# Patient Record
Sex: Male | Born: 1990 | Race: White | Hispanic: No | Marital: Single
Health system: Midwestern US, Community
[De-identification: ages and names within clinical notes are randomized; demographics above are authoritative.]

## PROBLEM LIST (undated history)

## (undated) HISTORY — PX: TONSILLECTOMY: SUR1361

---

## 2013-11-16 ENCOUNTER — Inpatient Hospital Stay: Admit: 2013-11-16 | Discharge: 2013-11-16 | Disposition: A | Discharge status: Home / Self Care

## 2013-11-16 MED ORDER — CIPROFLOXACIN HCL 500 MG PO TABS
500 MG | ORAL_TABLET | Freq: Two times a day (BID) | ORAL | Status: AC
Start: 2013-11-16 — End: 2013-11-21

## 2013-11-16 MED ORDER — IBUPROFEN 800 MG PO TABS
800 MG | ORAL_TABLET | Freq: Four times a day (QID) | ORAL | Status: AC | PRN
Start: 2013-11-16 — End: 2013-11-21

## 2013-11-16 MED ADMIN — ciprofloxacin (CIPRO) tablet 500 mg: 500 mg | ORAL | @ 15:00:00 | NDC 51079018201

## 2013-11-16 MED ADMIN — Tetanus-Diphth-Acell Pertussis (BOOSTRIX) 5-2.5-18.5 LF-MCG/0.5 injection: 0.5 | INTRAMUSCULAR | @ 15:00:00 | NDC 58160084243

## 2013-11-16 MED FILL — BOOSTRIX 5-2.5-18.5 IM SUSP: INTRAMUSCULAR | Qty: 0.5

## 2013-11-16 MED FILL — CIPROFLOXACIN HCL 500 MG PO TABS: 500 MG | ORAL | Qty: 1

## 2013-11-16 NOTE — ED Provider Notes (Signed)
Independent MLP  HPI:  11/16/2013, Time: 10:40 AM         Isaac Martin is a 23 y.o. male presenting to the ED for stepped  on a nail, beginning prior to arrival.  The complaint has been persistent, mild in severity, and worsened by nothing. Patient states he was cleaning out a building when he stepped on a rusty nail that went through the shoe just prior to arrival. He states his tetanus is not up-to-date. Denies any numbness or tingling of the foot is able to weight-bear has pain at the site of the puncture wound. Leading is controlled      Review of Systems:   Pertinent positives and negatives are stated within HPI, all other systems reviewed and are negative.          --------------------------------------------- PAST HISTORY ---------------------------------------------  Past Medical History:  has no past medical history on file.    Past Surgical History:  has past surgical history that includes Tonsillectomy.    Social History:  reports that he has never smoked. He has never used smokeless tobacco. He reports that he does not drink alcohol or use illicit drugs.    Family History: family history is not on file.     The patient???s home medications have been reviewed.    Allergies: Amoxicillin    -------------------------------------------------- RESULTS -------------------------------------------------  All laboratory and radiology results have been personally reviewed by myself   LABS:  No results found for this visit on 11/16/13.    RADIOLOGY:  Interpreted by Radiologist.  XR FOOT LEFT STANDARD    Final Result: IMPRESSION:           1. Soft tissue swelling.         2. No fracture seen.         3 If there is ongoing clinical concern for the great toe    ,additional coned-down views should be obtained thereof to include    a true lateral view without overlap of the other digits.       ------------------------- NURSING NOTES AND VITALS REVIEWED ---------------------------   The nursing notes within the ED encounter  and vital signs as below have been reviewed.   BP 163/79    Pulse 73    Temp(Src) 96.9 ??F (36.1 ??C) (Temporal)    Resp 14    Ht 6\' 3"  (1.905 m)    Wt 200 lb (90.719 kg)    BMI 25.00 kg/m2      SpO2 99%   Oxygen Saturation Interpretation: Normal      ---------------------------------------------------PHYSICAL EXAM--------------------------------------      Constitutional/General: Alert and oriented x3, well appearing, non toxic in NAD  Head: Normocephalic and atraumatic  Eyes: PERRL, EOMI  Mouth: Oropharynx clear, handling secretions, no trismus  Neck: Supple, full ROM,   Pulmonary: Lungs clear to auscultation bilaterally, no wheezes, rales, or rhonchi. Not in respiratory distress  Cardiovascular:  Regular rate and rhythm, no murmurs, gallops, or rubs. 2+ distal pulses  Abdomen: Soft, non tender, non distended,   Extremities: Moves all extremities x 4. Warm and well perfused no tenderness to palpation of the dorsal plantar aspect of the foot. Normal sensation Refill less than 2 seconds normal pulses  Skin: warm and dry without rash puncture wounds of the plantar aspect of the 1st metatarsal Neurologic: GCS 15,  Psych: Normal Affect      ------------------------------ ED COURSE/MEDICAL DECISION MAKING----------------------  Medications   Tetanus-Diphth-Acell Pertussis (BOOSTRIX) injection 0.5 mL (0.5 mLs Intramuscular Given  11/16/13 1119)   ciprofloxacin (CIPRO) tablet 500 mg (500 mg Oral Given 11/16/13 1119)         Medical Decision Making:    Will discharge patient on Cipro and Motrin 800 mg every 6 hours when necessary follow up with primary care 1-2 days    Counseling:   The emergency provider has spoken with the patient and discussed today???s results, in addition to providing specific details for the plan of care and counseling regarding the diagnosis and prognosis.  Questions are answered at this time and they are agreeable with the plan.      --------------------------------- IMPRESSION AND DISPOSITION  ---------------------------------    IMPRESSION  1. Puncture wound of foot        DISPOSITION  Disposition: Discharge to home  Patient condition is good      NOTE: This report was transcribed using voice recognition software. Every effort was made to ensure accuracy; however, inadvertent computerized transcription errors may be present          Griffin BasilRenee M Rouweyha, PA  11/16/13 [address]    Renee M OdenvilleRouweyha, [place]  11/16/13 1134    ATTENDING PROVIDER ATTESTATION:     Supervising Physician, on-site, available for consultation, non-participatory in the evaluation or care of this patient      Delano MetzRobert T Roniyah Llorens, DO  11/23/13 1415

## 2018-01-05 ENCOUNTER — Emergency Department
Admission: EM | Admit: 2018-01-05 | Discharge: 2018-01-05 | Disposition: A | Discharge status: Home / Self Care | Payer: Self-pay | Attending: Emergency Medicine | Admitting: Emergency Medicine

## 2018-01-05 ENCOUNTER — Encounter: Payer: Self-pay | Admitting: Emergency Medicine

## 2018-01-05 ENCOUNTER — Other Ambulatory Visit: Payer: Self-pay

## 2018-01-05 DIAGNOSIS — F1910 Other psychoactive substance abuse, uncomplicated: Secondary | ICD-10-CM | POA: Insufficient documentation

## 2018-01-05 DIAGNOSIS — F329 Major depressive disorder, single episode, unspecified: Secondary | ICD-10-CM | POA: Insufficient documentation

## 2018-01-05 LAB — COMPREHENSIVE METABOLIC PANEL
ALK PHOS: 75 U/L (ref 38–126)
ALT: 15 U/L (ref 0–44)
AST: 25 U/L (ref 15–41)
Albumin: 4.8 g/dL (ref 3.5–5.0)
Anion gap: 8 (ref 5–15)
BUN: 16 mg/dL (ref 6–20)
CHLORIDE: 107 mmol/L (ref 98–111)
CO2: 26 mmol/L (ref 22–32)
CREATININE: 0.96 mg/dL (ref 0.61–1.24)
Calcium: 9.6 mg/dL (ref 8.9–10.3)
GFR calc Af Amer: >60 mL/min (ref >60)
Glucose, Bld: 102 mg/dL — ABNORMAL HIGH (ref 70–99)
Potassium: 4.2 mmol/L (ref 3.5–5.1)
SODIUM: 141 mmol/L (ref 135–145)
Total Bilirubin: 0.5 mg/dL (ref 0.3–1.2)
Total Protein: 8.2 g/dL — ABNORMAL HIGH (ref 6.5–8.1)

## 2018-01-05 LAB — ETHANOL: Alcohol, Ethyl (B): <10 mg/dL (ref <10)

## 2018-01-05 LAB — URINE DRUG SCREEN, QUALITATIVE (ARMC ONLY)
Amphetamines, Ur Screen: NOT DETECTED
Barbiturates, Ur Screen: NOT DETECTED
Cannabinoid 50 Ng, Ur ~~LOC~~: POSITIVE — AB
Cocaine Metabolite,Ur ~~LOC~~: NOT DETECTED
MDMA (Ecstasy)Ur Screen: NOT DETECTED
Methadone Scn, Ur: NOT DETECTED
OPIATE, UR SCREEN: NOT DETECTED
PHENCYCLIDINE (PCP) UR S: NOT DETECTED
Tricyclic, Ur Screen: NOT DETECTED

## 2018-01-05 LAB — CBC
HEMATOCRIT: 42.8 % (ref 40.0–52.0)
Hemoglobin: 14.8 g/dL (ref 13.0–18.0)
MCH: 29.9 pg (ref 26.0–34.0)
MCHC: 34.5 g/dL (ref 32.0–36.0)
MCV: 86.6 fL (ref 80.0–100.0)
PLATELETS: 297 10*3/uL (ref 150–440)
RBC: 4.94 MIL/uL (ref 4.40–5.90)
RDW: 13.2 % (ref 11.5–14.5)
WBC: 11.2 10*3/uL — ABNORMAL HIGH (ref 3.8–10.6)

## 2018-01-05 NOTE — ED Notes (Signed)
Pt dressed out into appropriate behavioral health clothing with this tech and Dave,medic in the rm. Pt belongings consist of brown crocs, tan shorts, a red shirt, a camouflage hat, a black cell phone, a dark brown belt and grey boxers. Pt calm and cooperative while dressing out. Dad took home pt black wallet and a white cell phone charger.

## 2018-01-05 NOTE — ED Notes (Signed)
Pt last used one week ago. Is worried about the "whole fentanyl and heroin deaths" and would like to get detox. [place] time [place]" [place] [facility] [place]. Calm and cooperative

## 2018-01-05 NOTE — ED Notes (Signed)
Urine sent down by tech

## 2018-01-05 NOTE — Discharge Instructions (Addendum)
We have provided several resources for outpatient substance abuse counseling.  You should follow-up as soon as you are able to.  Return to the ER for persistent substance abuse problems, any thoughts of wanting to hurt yourself or anyone else, or any other new or worsening symptoms that concern you.

## 2018-01-05 NOTE — ED Triage Notes (Signed)
Pt arrived via POV with reports of substance abuse. Pt states he has been using IV heroin and fentanyl. Pt states he takes gapapentin daily and xanax night for sleep.  Pt states he has been using any drugs he can and occasionally drinks alcohol, pt states also has been using marijuana daily.  Pt states he last drank alcohol the last 2 nights. 3-4  12 oz beers.  Pt also reports depression with thoughts of "I wish I was dead" Pt states "I don't want to kill myself" Reports SI thoughts only.  Pt states "its not in me to kill myself"    Pt has no psychiatrist and not currently on any antidepressants.

## 2018-01-05 NOTE — ED Notes (Signed)
Pt requesting to leave. Is willing to stay for discharge information.

## 2018-01-05 NOTE — ED Provider Notes (Signed)
[facility] Emergency Department Provider Note ____________________________________________   First MD Initiated Contact with Patient 01/05/18 1735     (approximate)  I have reviewed the triage vital signs and the nursing notes.   HISTORY  Chief Complaint Addiction Problem and Depression    HPI Christopher Stein is a 27 y.o. male with PMH as noted below who presents requesting substance abuse help.  The patient states that he has been abusing multiple both illicit and prescription substances over the last several weeks and wants to stop.  He reports using heroin, most recently 1 week ago as well as marijuana, alcohol most recently last night, and gabapentin and Xanax.  In triage it was documented that the patient expressed some SI, however I discussed this further with the patient.  The patient states that he felt bad and "like I wanted to die" several days back when he was withdrawing from heroin, but he states that he actually did not want to kill himself at any time, and denies SI or HI currently.  He has no prior history of depression or suicidal behavior.  He denies any acute medical complaints.  History reviewed. No pertinent past medical history.  There are no active problems to display for this patient.   Past Surgical History:  Procedure Laterality Date  . TONSILLECTOMY      Prior to Admission medications   Not on File    Allergies Amoxicillin  No family history on file.  Social History Social History   Tobacco Use  . Smoking status: Never Smoker  . Smokeless tobacco: Never Used  Substance Use Topics  . Alcohol use: Not on file  . Drug use: Not on file    Review of Systems  Constitutional: No fever. Eyes: No redness. ENT: No sore throat. Cardiovascular: Denies chest pain. Respiratory: Denies shortness of breath. Gastrointestinal: No vomiting.  Genitourinary: Negative for dysuria.  Musculoskeletal: Negative for back pain. Skin:  Negative for rash. Neurological: Negative for headache.   ____________________________________________   PHYSICAL EXAM:  VITAL SIGNS: ED Triage Vitals  Enc Vitals Group     BP 01/05/18 1639 140/90     Pulse Rate 01/05/18 1639 (!) 107     Resp 01/05/18 1639 20     Temp 01/05/18 1639 99.3 F (37.4 C)     Temp Source 01/05/18 1639 Oral     SpO2 01/05/18 1639 100 %     Weight 01/05/18 1639 190 lb (86.2 kg)     Height 01/05/18 1639 6\' 3"  (1.905 m)     Head Circumference --      Peak Flow --      Pain Score 01/05/18 1713 0     Pain Loc --      Pain Edu? --      Excl. in GC? --     Constitutional: Alert and oriented. Well appearing and in no acute distress. Eyes: Conjunctivae are normal.  Head: Atraumatic. Nose: No congestion/rhinnorhea. Mouth/Throat: Mucous membranes are moist.   Neck: Normal range of motion.  Cardiovascular: Good peripheral circulation. Respiratory: Normal respiratory effort.  Gastrointestinal:  No distention.  Musculoskeletal: Extremities warm and well perfused.  Neurologic:  Normal speech and language. No gross focal neurologic deficits are appreciated.  Skin:  Skin is warm and dry. No rash noted. Psychiatric: Mood and affect are normal.  Slightly anxious appearing.  Speech and behavior are normal.  ____________________________________________   LABS (all labs ordered are listed, but only abnormal results are displayed)  Labs Reviewed  COMPREHENSIVE METABOLIC PANEL - Abnormal; Notable for the following components:      Result Value   Glucose, Bld 102 (*)    Total Protein 8.2 (*)    All other components within normal limits  CBC - Abnormal; Notable for the following components:   WBC 11.2 (*)    All other components within normal limits  ETHANOL  URINE DRUG SCREEN, QUALITATIVE ([facility] ONLY)    ____________________________________________  EKG  ____________________________________________  RADIOLOGY    ____________________________________________   PROCEDURES  Procedure(s) performed: No  Procedures  Critical Care performed: No ____________________________________________   INITIAL IMPRESSION / ASSESSMENT AND PLAN / ED COURSE  Pertinent labs & imaging results that were available during my care of the patient were reviewed by me and considered in my medical decision making (see chart for details).  27 year old male presents voluntarily because of substance abuse, requesting help in terms of detox/rehabilitation.  The patient denies any acute psychiatric symptoms, or any acute medical symptoms.  His exam is unremarkable except for slight tachycardia which is most likely related to anxiety, or possible mild dehydration from his alcohol and other substance use.  At this time, the patient completely denies any SI or HI, and is not a danger to self or others.  There is no indication for [facility] tele-psych consultation or involuntary commitment.  We will consult TTS to provide the patient with detox/rehab resources.  ----------------------------------------- 6:49 PM on 01/05/2018 -----------------------------------------  The patient states that he would like to leave.  He does not want to wait for TTS.  He states that he is perfectly satisfied with just getting some outpatient referrals in the discharge paperwork.  I once again confirmed that the patient has no thoughts of suicide or self-harm, no thoughts to harm anyone else.  He is not a danger to self or others at this time.  He has no evidence of clinically significant withdrawal, and he is stable for discharge at this time.  I gave him thorough return precautions, and he expressed understanding.   ____________________________________________   FINAL CLINICAL IMPRESSION(S) / ED DIAGNOSES  Final diagnoses:   Polysubstance abuse (HCC)      NEW MEDICATIONS STARTED DURING THIS VISIT:  New Prescriptions   No medications on file     Note:  This document was prepared using Dragon voice recognition software and may include unintentional dictation errors.    Dionne Bucy, MD 01/05/18 1850

## 2018-02-05 ENCOUNTER — Emergency Department: Payer: Self-pay

## 2018-02-05 ENCOUNTER — Inpatient Hospital Stay
Admission: EM | Admit: 2018-02-05 | Discharge: 2018-02-07 | DRG: 917 | Discharge status: Against Medical Advice | Payer: Self-pay | Attending: Internal Medicine | Admitting: Internal Medicine

## 2018-02-05 ENCOUNTER — Other Ambulatory Visit: Payer: Self-pay

## 2018-02-05 DIAGNOSIS — T40601A Poisoning by unspecified narcotics, accidental (unintentional), initial encounter: Secondary | ICD-10-CM

## 2018-02-05 DIAGNOSIS — T50901A Poisoning by unspecified drugs, medicaments and biological substances, accidental (unintentional), initial encounter: Secondary | ICD-10-CM | POA: Diagnosis present

## 2018-02-05 DIAGNOSIS — G9340 Encephalopathy, unspecified: Secondary | ICD-10-CM

## 2018-02-05 DIAGNOSIS — D72829 Elevated white blood cell count, unspecified: Secondary | ICD-10-CM | POA: Diagnosis present

## 2018-02-05 DIAGNOSIS — Z881 Allergy status to other antibiotic agents status: Secondary | ICD-10-CM

## 2018-02-05 DIAGNOSIS — T401X1A Poisoning by heroin, accidental (unintentional), initial encounter: Principal | ICD-10-CM | POA: Diagnosis present

## 2018-02-05 DIAGNOSIS — F1290 Cannabis use, unspecified, uncomplicated: Secondary | ICD-10-CM | POA: Diagnosis present

## 2018-02-05 DIAGNOSIS — R7989 Other specified abnormal findings of blood chemistry: Secondary | ICD-10-CM

## 2018-02-05 DIAGNOSIS — F1994 Other psychoactive substance use, unspecified with psychoactive substance-induced mood disorder: Secondary | ICD-10-CM

## 2018-02-05 DIAGNOSIS — R778 Other specified abnormalities of plasma proteins: Secondary | ICD-10-CM

## 2018-02-05 DIAGNOSIS — R6510 Systemic inflammatory response syndrome (SIRS) of non-infectious origin without acute organ dysfunction: Secondary | ICD-10-CM | POA: Diagnosis present

## 2018-02-05 DIAGNOSIS — F1120 Opioid dependence, uncomplicated: Secondary | ICD-10-CM

## 2018-02-05 DIAGNOSIS — I514 Myocarditis, unspecified: Secondary | ICD-10-CM | POA: Diagnosis present

## 2018-02-05 DIAGNOSIS — G92 Toxic encephalopathy: Secondary | ICD-10-CM | POA: Diagnosis present

## 2018-02-05 LAB — COMPREHENSIVE METABOLIC PANEL
ALBUMIN: 4.2 g/dL (ref 3.5–5.0)
ALT: 131 U/L — AB (ref 0–44)
AST: 147 U/L — AB (ref 15–41)
Alkaline Phosphatase: 65 U/L (ref 38–126)
Anion gap: 7 (ref 5–15)
BILIRUBIN TOTAL: 0.3 mg/dL (ref 0.3–1.2)
BUN: 20 mg/dL (ref 6–20)
CO2: 28 mmol/L (ref 22–32)
CREATININE: 0.97 mg/dL (ref 0.61–1.24)
Calcium: 8.9 mg/dL (ref 8.9–10.3)
Chloride: 106 mmol/L (ref 98–111)
GFR calc Af Amer: >60 mL/min (ref >60)
GLUCOSE: 130 mg/dL — AB (ref 70–99)
POTASSIUM: 4.7 mmol/L (ref 3.5–5.1)
Sodium: 141 mmol/L (ref 135–145)
TOTAL PROTEIN: 7.2 g/dL (ref 6.5–8.1)

## 2018-02-05 LAB — CBC WITH DIFFERENTIAL/PLATELET
BASOS ABS: 0 10*3/uL (ref 0–0.1)
BASOS PCT: 0 %
Eosinophils Absolute: 0 10*3/uL (ref 0–0.7)
Eosinophils Relative: 0 %
HEMATOCRIT: 38.7 % — AB (ref 40.0–52.0)
Hemoglobin: 12.7 g/dL — ABNORMAL LOW (ref 13.0–18.0)
LYMPHS PCT: 6 %
Lymphs Abs: 1.4 10*3/uL (ref 1.0–3.6)
MCH: 28.9 pg (ref 26.0–34.0)
MCHC: 32.8 g/dL (ref 32.0–36.0)
MCV: 88.1 fL (ref 80.0–100.0)
Monocytes Absolute: 1.7 10*3/uL — ABNORMAL HIGH (ref 0.2–1.0)
Monocytes Relative: 8 %
NEUTROS ABS: 20 10*3/uL — AB (ref 1.4–6.5)
Neutrophils Relative %: 86 %
Platelets: 271 10*3/uL (ref 150–440)
RBC: 4.39 MIL/uL — AB (ref 4.40–5.90)
RDW: 13.4 % (ref 11.5–14.5)
WBC: 23.2 10*3/uL — ABNORMAL HIGH (ref 3.8–10.6)

## 2018-02-05 LAB — CK: Total CK: 492 U/L — ABNORMAL HIGH (ref 49–397)

## 2018-02-05 LAB — LACTIC ACID, PLASMA: LACTIC ACID, VENOUS: 1.6 mmol/L (ref 0.5–1.9)

## 2018-02-05 LAB — TROPONIN I
TROPONIN I: 0.16 ng/mL — AB (ref <0.03)
Troponin I: 0.07 ng/mL (ref <0.03)

## 2018-02-05 MED ORDER — SODIUM CHLORIDE 0.9 % IV BOLUS
1000.0000 mL | Freq: Once | INTRAVENOUS | Status: AC
  Administered 2018-02-05: 1000 mL via INTRAVENOUS

## 2018-02-05 MED ORDER — HEPARIN (PORCINE) IN NACL 100-0.45 UNIT/ML-% IJ SOLN
1250.0000 [IU]/h | INTRAMUSCULAR | Status: DC
  Administered 2018-02-05: 1100 [IU]/h via INTRAVENOUS
  Administered 2018-02-06: 1250 [IU]/h via INTRAVENOUS
  Filled 2018-02-05 (×3): qty 250

## 2018-02-05 MED ORDER — HEPARIN BOLUS VIA INFUSION
4000.0000 [IU] | Freq: Once | INTRAVENOUS | Status: AC
  Administered 2018-02-05: 4000 [IU] via INTRAVENOUS
  Filled 2018-02-05: qty 4000

## 2018-02-05 MED ORDER — FLAVOXATE HCL 100 MG PO TABS
200.0000 mg | ORAL_TABLET | Freq: Once | ORAL | Status: DC
  Filled 2018-02-05: qty 2

## 2018-02-05 MED ORDER — ONDANSETRON HCL 4 MG/2ML IJ SOLN
4.0000 mg | Freq: Once | INTRAMUSCULAR | Status: AC
  Administered 2018-02-05: 4 mg via INTRAVENOUS

## 2018-02-05 MED ORDER — METOCLOPRAMIDE HCL 5 MG/ML IJ SOLN
10.0000 mg | Freq: Once | INTRAMUSCULAR | Status: AC
  Administered 2018-02-05: 10 mg via INTRAVENOUS

## 2018-02-05 MED ORDER — METOCLOPRAMIDE HCL 5 MG/ML IJ SOLN
INTRAMUSCULAR | Status: AC
  Filled 2018-02-05: qty 2

## 2018-02-05 MED ORDER — ONDANSETRON HCL 4 MG/2ML IJ SOLN
INTRAMUSCULAR | Status: AC
  Filled 2018-02-05: qty 2

## 2018-02-05 MED ORDER — NALOXONE HCL 0.4 MG/ML IJ SOLN: 0.2000 mg | Freq: Once | INTRAMUSCULAR | Status: DC

## 2018-02-05 MED ORDER — NALOXONE HCL 2 MG/2ML IJ SOSY
PREFILLED_SYRINGE | INTRAMUSCULAR | Status: AC
  Administered 2018-02-05: 0.2 mg
  Filled 2018-02-05: qty 2

## 2018-02-05 MED ORDER — ASPIRIN 81 MG PO CHEW
324.0000 mg | CHEWABLE_TABLET | Freq: Once | ORAL | Status: AC
  Administered 2018-02-05: 324 mg via ORAL

## 2018-02-05 NOTE — ED Notes (Signed)
Family has gone home and left number. Mother is Angelique Blonder. Telephone number [phone].

## 2018-02-05 NOTE — ED Notes (Signed)
Patient is resting comfortably. 

## 2018-02-05 NOTE — ED Notes (Signed)
Pt denies pain at this time

## 2018-02-05 NOTE — ED Triage Notes (Signed)
Pt to the er for overdose. Pt states he was shooting up his normal amount of heroin but believes someone cut it with fentanyl. Pt was found by family in bathroom floor. No narcan given by EMS. No respiratory issues.

## 2018-02-05 NOTE — ED Provider Notes (Addendum)
[facility] Emergency Department Provider Note    First MD Initiated Contact with Patient 02/05/18 1944     (approximate)  I have reviewed the triage vital signs and the nursing notes.   HISTORY  Chief Complaint Drug Overdose    HPI Christopher Stein is a 27 y.o. male presents the ER after unintentional overdose on heroin.  States he uses weekly.  States that he was injecting his normal amount but thinks it was mixed with fentanyl.  Does not remember exactly when he was injecting.  Patient was found by family members unresponsive.  EMS was called.  They found the patient he was responsive.  Protecting his airway therefore Phenergan was not given.  He does feel very thirsty.    History reviewed. No pertinent past medical history. No family history on file. Past Surgical History:  Procedure Laterality Date  . TONSILLECTOMY     There are no active problems to display for this patient.     Prior to Admission medications   Not on File    Allergies Amoxicillin    Social History Social History   Tobacco Use  . Smoking status: Never Smoker  . Smokeless tobacco: Never Used  Substance Use Topics  . Alcohol use: Yes    Comment: monthly  . Drug use: Yes    Types: Marijuana    Comment: heroin, xanax,    Review of Systems Patient denies headaches, rhinorrhea, blurry vision, numbness, shortness of breath, chest pain, edema, cough, abdominal pain, nausea, vomiting, diarrhea, dysuria, fevers, rashes or hallucinations unless otherwise stated above in HPI. ____________________________________________   PHYSICAL EXAM:  VITAL SIGNS: Vitals:   02/05/18 2131 02/05/18 2136  BP:    Pulse: (!) 116 (!) 113  Resp: 18 (!) 25  Temp:    SpO2: 100% 98%    Constitutional: Alert and oriented.  Eyes: Conjunctivae are normal. Pupils pinpoint Head: Atraumatic. Nose: No congestion/rhinnorhea. Mouth/Throat: Mucous membranes are moist.   Neck: No stridor.  Painless ROM.  Cardiovascular: Normal rate, regular rhythm. Grossly normal heart sounds.  Good peripheral circulation. Respiratory: Normal respiratory effort.  No retractions. Lungs CTAB. Gastrointestinal: Soft and nontender. No distention. No abdominal bruits. No CVA tenderness. Genitourinary:  Musculoskeletal: No lower extremity tenderness nor edema.  No joint effusions. Neurologic:  Normal speech and language. No gross focal neurologic deficits are appreciated. No facial droop Skin:  Skin is warm, dry and intact. No rash noted. Psychiatric: Mood and affect are normal. Speech and behavior are normal.  ____________________________________________   LABS (all labs ordered are listed, but only abnormal results are displayed)  Results for orders placed or performed during the hospital encounter of 02/05/18 (from the past 24 hour(s))  Troponin I     Status: Abnormal   Collection Time: 02/05/18  7:44 PM  Result Value Ref Range   Troponin I 0.07 (HH) <0.03 ng/mL  CBC with Differential/Platelet     Status: Abnormal   Collection Time: 02/05/18  7:44 PM  Result Value Ref Range   WBC 23.2 (H) 3.8 - 10.6 K/uL   RBC 4.39 (L) 4.40 - 5.90 MIL/uL   Hemoglobin 12.7 (L) 13.0 - 18.0 g/dL   HCT 16.1 (L) 09.6 - 04.5 %   MCV 88.1 80.0 - 100.0 fL   MCH 28.9 26.0 - 34.0 pg   MCHC 32.8 32.0 - 36.0 g/dL   RDW 40.9 81.1 - 91.4 %   Platelets 271 150 - 440 K/uL   Neutrophils Relative % 86 %  Neutro Abs 20.0 (H) 1.4 - 6.5 K/uL   Lymphocytes Relative 6 %   Lymphs Abs 1.4 1.0 - 3.6 K/uL   Monocytes Relative 8 %   Monocytes Absolute 1.7 (H) 0.2 - 1.0 K/uL   Eosinophils Relative 0 %   Eosinophils Absolute 0.0 0 - 0.7 K/uL   Basophils Relative 0 %   Basophils Absolute 0.0 0 - 0.1 K/uL  Comprehensive metabolic panel     Status: Abnormal   Collection Time: 02/05/18  7:44 PM  Result Value Ref Range   Sodium 141 135 - 145 mmol/L   Potassium 4.7 3.5 - 5.1 mmol/L   Chloride 106 98 - 111 mmol/L   CO2 28 22  - 32 mmol/L   Glucose, Bld 130 (H) 70 - 99 mg/dL   BUN 20 6 - 20 mg/dL   Creatinine, Ser 4.93 0.61 - 1.24 mg/dL   Calcium 8.9 8.9 - 24.1 mg/dL   Total Protein 7.2 6.5 - 8.1 g/dL   Albumin 4.2 3.5 - 5.0 g/dL   AST 991 (H) 15 - 41 U/L   ALT 131 (H) 0 - 44 U/L   Alkaline Phosphatase 65 38 - 126 U/L   Total Bilirubin 0.3 0.3 - 1.2 mg/dL   GFR calc non Af Amer >60 >60 mL/min   GFR calc Af Amer >60 >60 mL/min   Anion gap 7 5 - 15  CK     Status: Abnormal   Collection Time: 02/05/18  7:44 PM  Result Value Ref Range   Total CK 492 (H) 49 - 397 U/L  Urine Drug Screen, Qualitative ([facility] only)     Status: Abnormal   Collection Time: 02/05/18 10:40 PM  Result Value Ref Range   Tricyclic, Ur Screen NONE DETECTED NONE DETECTED   Amphetamines, Ur Screen NONE DETECTED NONE DETECTED   MDMA (Ecstasy)Ur Screen NONE DETECTED NONE DETECTED   Cocaine Metabolite,Ur [place] POSITIVE (A) NONE DETECTED   Opiate, Ur Screen POSITIVE (A) NONE DETECTED   Phencyclidine (PCP) Ur S NONE DETECTED NONE DETECTED   Cannabinoid 50 Ng, Ur [place] POSITIVE (A) NONE DETECTED   Barbiturates, Ur Screen NONE DETECTED NONE DETECTED   Benzodiazepine, Ur Scrn POSITIVE (A) NONE DETECTED   Methadone Scn, Ur NONE DETECTED NONE DETECTED  Troponin I     Status: Abnormal   Collection Time: 02/05/18 10:40 PM  Result Value Ref Range   Troponin I 0.16 (HH) <0.03 ng/mL  Lactic acid, plasma     Status: None   Collection Time: 02/05/18 10:40 PM  Result Value Ref Range   Lactic Acid, Venous 1.6 0.5 - 1.9 mmol/L   ____________________________________________  EKG My review and personal interpretation at Time: 19:45   Indication: OD  Rate: 110  Rhythm: sinus Axis: normal Other:  Normal intervals, non specific st abn.  Epsilon wave in inferior and V4-V6? Vs nonspecific IVCD   My review and personal interpretation at Time: 23:39   Indication: OD  Rate: 110  Rhythm: sinus Axis: normal Other:  Normal intervals, non specific st abn.  delta st  elevations now present in her lateral leads.  No reciprocal depression ____________________________________________  RADIOLOGY   ____________________________________________   PROCEDURES  Procedure(s) performed:  .Critical Care Performed by: Willy Eddy, MD Authorized by: Willy Eddy, MD   Critical care provider statement:    Critical care time (minutes):  45   Critical care time was exclusive of:  Separately billable procedures and treating other patients   Critical care was necessary  to treat or prevent imminent or life-threatening deterioration of the following conditions:  Metabolic crisis and toxidrome   Critical care was time spent personally by me on the following activities:  Development of treatment plan with patient or surrogate, discussions with consultants, evaluation of patient's response to treatment, examination of patient, obtaining history from patient or surrogate, ordering and performing treatments and interventions, ordering and review of laboratory studies, ordering and review of radiographic studies, pulse oximetry, re-evaluation of patient's condition and review of old charts      Critical Care performed: yes ____________________________________________   INITIAL IMPRESSION / ASSESSMENT AND PLAN / ED COURSE  Pertinent labs & imaging results that were available during my care of the patient were reviewed by me and considered in my medical decision making (see chart for details).   DDX: overdose, dysrhythmia, electrolytea bn, polysubstance abuse  Christopher Stein is a 27 y.o. who presents to the ED with AMS as described above.  Patient currently protecting his airway.  He is afebrile but mildly tachycardic.  Is slightly encephalopathic.  Denies any pain.  Uncertain downtime.  Given his abnormal presentation will order blood work to evaluate for the above differential.  EKG does show some nonspecific changes and abnormal epsilon wave.  No hypothermia.  May  be normal variant.  Clinical Course as of Feb 07 11  Wed Feb 05, 2018  2058 Troponin noted with elevation of 0.07.  Mother now at bedside.  No report of sudden cardiac death.  Patient denies any cocaine use.  Denies any recent fevers or chest pain.  Does have some nausea at this time.  Possible vasospasm or demand ischemia in the setting of hypoxic episode after overdose.  Do not appreciate any murmurs on cardiac exam on repeat evaluation.  He has had several episodes of vomiting.  Not having any abdominal pain at this time.  Requesting something to drink.  Will send for UDS.  Will serial enzymes and trend troponin.  No fever at this time.  We will continue to monitor.   [PR]  2327 Patient with persistent tachycardia.  Borderline elevation of CK.  No lactic acidosis.  CT imaging without any evidence of bleed.  Based on his presentation I do believe he would benefit from further cardiac monitored during and serial enzymes with admission the hospital.   [PR]  2333 She is repeat troponin did double therefore will heparinize.  Possible myocarditis given leukocytosis.  Cultures have been ordered.  Based on his presentation will admit to hospital for further evaluation, echocardiogram and cardiac evaluation.   [PR]  2337 Patient does admit to injecting cocaine over the past week.   [PR]  2350 Patient's repeat EKG does show delta changes with no ST elevation in V3 V4 V5.  Patient is completely pain-free at this time.  States that he feels much better.  Patient heparinized and given aspirin.  Stat page is called to cardiology.  I discussed case with Dr. Gwen Pounds.  Given his absence of pain or discomfort at this time likely secondary to metabolic process.  Possible SCAD though would expect the patient to be more symptomatic at this time.  Does seem primarily metabolic at this point as there is no true reciprocal changes.  Given diffuse ST changes now with evidence of PR depression do suspect some component of  pericarditis or myocarditis.  Would expect him to be having some form of chest discomfort if this is true ACS.    [PR]    Clinical  Course User Index [PR] Willy Eddy, MD     As part of my medical decision making, I reviewed the following data within the electronic MEDICAL RECORD NUMBER Nursing notes reviewed and incorporated, Labs reviewed, notes from prior ED visits.   ____________________________________________   FINAL CLINICAL IMPRESSION(S) / ED DIAGNOSES  Final diagnoses:  Accidental drug overdose, initial encounter  Elevated troponin  Acute encephalopathy  SIRS (systemic inflammatory response syndrome) (HCC)      NEW MEDICATIONS STARTED DURING THIS VISIT:  New Prescriptions   No medications on file     Note:  This document was prepared using Dragon voice recognition software and may include unintentional dictation errors.    Willy Eddy, MD 02/05/18 2329    Willy Eddy, MD 02/05/18 6213    Willy Eddy, MD 02/05/18 0865    Willy Eddy, MD 02/06/18 [phone]

## 2018-02-05 NOTE — Progress Notes (Signed)
ANTICOAGULATION CONSULT NOTE - Initial Consult  Pharmacy Consult for heparin Indication: chest pain/ACS  Allergies  Allergen Reactions  . Amoxicillin Rash and Other (See Comments)    Has patient had a PCN reaction causing immediate rash, facial/tongue/throat swelling, SOB or lightheadedness with hypotension: Unknown Has patient had a PCN reaction causing severe rash involving mucus membranes or skin necrosis: Unknown Has patient had a PCN reaction that required hospitalization: Unknown Has patient had a PCN reaction occurring within the last 10 years: No If all of the above answers are "NO", then may proceed with Cephalosporin use.     Patient Measurements: Height: 6\' 4"  (193 cm) Weight: 185 lb (83.9 kg) IBW/kg (Calculated) : 86.8 Heparin Dosing Weight: 84 kg  Vital Signs: Temp: 97.7 F (36.5 C) (09/11 1943) Temp Source: Oral (09/11 1943) BP: 94/69 (09/11 2130) Pulse Rate: 113 (09/11 2136)  Labs: Recent Labs    02/05/18 1944 02/05/18 2240  HGB 12.7*  --   HCT 38.7*  --   PLT 271  --   CREATININE 0.97  --   CKTOTAL 492*  --   TROPONINI 0.07* 0.16*    Estimated Creatinine Clearance: 135.7 mL/min (by C-G formula based on SCr of 0.97 mg/dL).   Medical History: History reviewed. No pertinent past medical history.  Medications:  Scheduled:  . aspirin  324 mg Oral Once  . flavoxATE  200 mg Oral Once  . heparin  4,000 Units Intravenous Once  . naLOXone (NARCAN)  injection  0.2 mg Intravenous Once    Assessment: Patient admitted s/t drug overdose from heroin that was apparently cut w/ fentanyl. Patient has initial trops of 0.07 >> 0.16. Patient does not take any meds or anticoagulants. EKG shows borderline ST elevation Patient is being started on heparin drip for acute infarct   Goal of Therapy:  Heparin level 0.3-0.7 units/ml Monitor platelets by anticoagulation protocol: Yes   Plan:  Will bolus w/ heparin 4000 units IV x 1 Will start rate at 1100 units/hr   Baseline labs drawn Will check HL w/ am labs. Will monitor daily CBC's and adjust per HL's  Thomasene Ripple, PharmD, BCPS Clinical Pharmacist 02/05/2018

## 2018-02-06 ENCOUNTER — Inpatient Hospital Stay
Admit: 2018-02-06 | Discharge: 2018-02-06 | Disposition: A | Discharge status: Home / Self Care | Payer: Self-pay | Attending: Internal Medicine | Admitting: Internal Medicine

## 2018-02-06 DIAGNOSIS — T40601A Poisoning by unspecified narcotics, accidental (unintentional), initial encounter: Secondary | ICD-10-CM

## 2018-02-06 DIAGNOSIS — F1994 Other psychoactive substance use, unspecified with psychoactive substance-induced mood disorder: Secondary | ICD-10-CM

## 2018-02-06 DIAGNOSIS — T50901A Poisoning by unspecified drugs, medicaments and biological substances, accidental (unintentional), initial encounter: Secondary | ICD-10-CM | POA: Diagnosis present

## 2018-02-06 DIAGNOSIS — F1120 Opioid dependence, uncomplicated: Secondary | ICD-10-CM

## 2018-02-06 LAB — BASIC METABOLIC PANEL
Anion gap: 10 (ref 5–15)
BUN: 16 mg/dL (ref 6–20)
CHLORIDE: 103 mmol/L (ref 98–111)
CO2: 26 mmol/L (ref 22–32)
Calcium: 8.9 mg/dL (ref 8.9–10.3)
Creatinine, Ser: 0.85 mg/dL (ref 0.61–1.24)
GFR calc Af Amer: >60 mL/min (ref >60)
GFR calc non Af Amer: >60 mL/min (ref >60)
GLUCOSE: 94 mg/dL (ref 70–99)
POTASSIUM: 4.3 mmol/L (ref 3.5–5.1)
SODIUM: 139 mmol/L (ref 135–145)

## 2018-02-06 LAB — URINE DRUG SCREEN, QUALITATIVE (ARMC ONLY)
Amphetamines, Ur Screen: NOT DETECTED
BARBITURATES, UR SCREEN: NOT DETECTED
Benzodiazepine, Ur Scrn: POSITIVE — AB
CANNABINOID 50 NG, UR ~~LOC~~: POSITIVE — AB
COCAINE METABOLITE, UR ~~LOC~~: POSITIVE — AB
MDMA (ECSTASY) UR SCREEN: NOT DETECTED
Methadone Scn, Ur: NOT DETECTED
Opiate, Ur Screen: POSITIVE — AB
PHENCYCLIDINE (PCP) UR S: NOT DETECTED
TRICYCLIC, UR SCREEN: NOT DETECTED

## 2018-02-06 LAB — TROPONIN I
Troponin I: 0.46 ng/mL (ref <0.03)
Troponin I: 0.36 ng/mL (ref <0.03)
Troponin I: 0.28 ng/mL (ref <0.03)

## 2018-02-06 LAB — PROTIME-INR
INR: 1.07
PROTHROMBIN TIME: 13.8 s (ref 11.4–15.2)

## 2018-02-06 LAB — CBC
HCT: 39.7 % — ABNORMAL LOW (ref 40.0–52.0)
Hemoglobin: 13.4 g/dL (ref 13.0–18.0)
MCH: 29.7 pg (ref 26.0–34.0)
MCHC: 33.8 g/dL (ref 32.0–36.0)
MCV: 88.1 fL (ref 80.0–100.0)
PLATELETS: 238 10*3/uL (ref 150–440)
RBC: 4.51 MIL/uL (ref 4.40–5.90)
RDW: 13.5 % (ref 11.5–14.5)
WBC: 14.6 10*3/uL — AB (ref 3.8–10.6)

## 2018-02-06 LAB — HEPARIN LEVEL (UNFRACTIONATED)
HEPARIN UNFRACTIONATED: 0.27 [IU]/mL — AB (ref 0.30–0.70)
Heparin Unfractionated: 0.37 IU/mL (ref 0.30–0.70)
Heparin Unfractionated: 0.39 IU/mL (ref 0.30–0.70)

## 2018-02-06 LAB — ECHOCARDIOGRAM COMPLETE
Height: 76 in
Weight: 2960 oz

## 2018-02-06 LAB — APTT: aPTT: 87 seconds — ABNORMAL HIGH (ref 24–36)

## 2018-02-06 MED ORDER — VITAMIN B-1 100 MG PO TABS
100.0000 mg | ORAL_TABLET | Freq: Every day | ORAL | Status: DC
  Administered 2018-02-06 – 2018-02-07 (×2): 100 mg via ORAL
  Filled 2018-02-06 (×2): qty 1

## 2018-02-06 MED ORDER — LORAZEPAM 2 MG PO TABS: 0.0000 mg | ORAL_TABLET | Freq: Two times a day (BID) | ORAL | Status: DC

## 2018-02-06 MED ORDER — LORAZEPAM 1 MG PO TABS: 1.0000 mg | ORAL_TABLET | Freq: Four times a day (QID) | ORAL | Status: DC | PRN

## 2018-02-06 MED ORDER — ASPIRIN EC 81 MG PO TBEC
81.0000 mg | DELAYED_RELEASE_TABLET | Freq: Every day | ORAL | Status: DC
  Administered 2018-02-06 – 2018-02-07 (×2): 81 mg via ORAL
  Filled 2018-02-06 (×2): qty 1

## 2018-02-06 MED ORDER — LORAZEPAM 2 MG/ML IJ SOLN
1.0000 mg | Freq: Four times a day (QID) | INTRAMUSCULAR | Status: DC | PRN
  Administered 2018-02-07: 1 mg via INTRAVENOUS
  Filled 2018-02-06: qty 1

## 2018-02-06 MED ORDER — DOCUSATE SODIUM 100 MG PO CAPS
100.0000 mg | ORAL_CAPSULE | Freq: Two times a day (BID) | ORAL | Status: DC
  Filled 2018-02-06: qty 1

## 2018-02-06 MED ORDER — HYDROCODONE-ACETAMINOPHEN 5-325 MG PO TABS
1.0000 | ORAL_TABLET | ORAL | Status: DC | PRN
  Administered 2018-02-06 – 2018-02-07 (×2): 2 via ORAL
  Filled 2018-02-06 (×2): qty 2

## 2018-02-06 MED ORDER — ADULT MULTIVITAMIN W/MINERALS CH
1.0000 | ORAL_TABLET | Freq: Every day | ORAL | Status: DC
  Administered 2018-02-06 – 2018-02-07 (×2): 1 via ORAL
  Filled 2018-02-06 (×2): qty 1

## 2018-02-06 MED ORDER — BUPRENORPHINE HCL-NALOXONE HCL 8-2 MG SL SUBL
2.0000 | SUBLINGUAL_TABLET | SUBLINGUAL | Status: AC
  Administered 2018-02-06: 2 via SUBLINGUAL
  Filled 2018-02-06: qty 2

## 2018-02-06 MED ORDER — SODIUM CHLORIDE 0.9 % IV SOLN
INTRAVENOUS | Status: DC
  Administered 2018-02-06 – 2018-02-07 (×3): via INTRAVENOUS

## 2018-02-06 MED ORDER — ONDANSETRON HCL 4 MG PO TABS: 4.0000 mg | ORAL_TABLET | Freq: Four times a day (QID) | ORAL | Status: DC | PRN

## 2018-02-06 MED ORDER — BUPRENORPHINE HCL-NALOXONE HCL 2-0.5 MG SL SUBL
2.0000 | SUBLINGUAL_TABLET | Freq: Two times a day (BID) | SUBLINGUAL | Status: DC
  Administered 2018-02-07: 2 via SUBLINGUAL
  Filled 2018-02-06: qty 2

## 2018-02-06 MED ORDER — HEPARIN SODIUM (PORCINE) 5000 UNIT/ML IJ SOLN: 5000.0000 [IU] | Freq: Three times a day (TID) | INTRAMUSCULAR | Status: DC

## 2018-02-06 MED ORDER — ACETAMINOPHEN 650 MG RE SUPP: 650.0000 mg | Freq: Four times a day (QID) | RECTAL | Status: DC | PRN

## 2018-02-06 MED ORDER — BISACODYL 5 MG PO TBEC: 5.0000 mg | DELAYED_RELEASE_TABLET | Freq: Every day | ORAL | Status: DC | PRN

## 2018-02-06 MED ORDER — THIAMINE HCL 100 MG/ML IJ SOLN: 100.0000 mg | Freq: Every day | INTRAMUSCULAR | Status: DC

## 2018-02-06 MED ORDER — LORAZEPAM 2 MG PO TABS
0.0000 mg | ORAL_TABLET | Freq: Four times a day (QID) | ORAL | Status: DC
  Administered 2018-02-06 – 2018-02-07 (×4): 2 mg via ORAL
  Administered 2018-02-07: 1 mg via ORAL
  Administered 2018-02-07: 2 mg via ORAL
  Filled 2018-02-06 (×6): qty 1

## 2018-02-06 MED ORDER — ACETAMINOPHEN 325 MG PO TABS: 650.0000 mg | ORAL_TABLET | Freq: Four times a day (QID) | ORAL | Status: DC | PRN

## 2018-02-06 MED ORDER — FOLIC ACID 1 MG PO TABS
1.0000 mg | ORAL_TABLET | Freq: Every day | ORAL | Status: DC
  Administered 2018-02-06 – 2018-02-07 (×2): 1 mg via ORAL
  Filled 2018-02-06 (×2): qty 1

## 2018-02-06 MED ORDER — ONDANSETRON HCL 4 MG/2ML IJ SOLN: 4.0000 mg | Freq: Four times a day (QID) | INTRAMUSCULAR | Status: DC | PRN

## 2018-02-06 NOTE — Consult Note (Signed)
[facility] Face-to-Face Psychiatry Consult   Reason for Consult: Consult for this 27 year old man who came into the hospital with an accidental drug overdose Referring Physician: Mody Patient Identification: Christopher Stein MRN:  [id number] Principal Diagnosis: Opiate overdose [facility]) Diagnosis:   Patient Active Problem List   Diagnosis Date Noted  . Drug overdose [T50.901A] 02/06/2018  . Opiate dependence ([place]) [F11.20] 02/06/2018  . Opiate overdose ([place]) [T40.601A] 02/06/2018  . Substance induced mood disorder [facility]) [F19.94] 02/06/2018    Total Time spent with patient: 1 hour  Subjective:   Christopher Stein is a 27 y.o. male patient admitted with "I guess they say I overdosed".  HPI: 27 year old man brought into the hospital with an apparent drug overdose.  Found passed out at home.  Patient has no memory of it.  He knows that he was hanging out at home and remembers that he was going to shoot up his usual heroin.  Patient showed some response to Narcan on the way into the hospital and then stabilized.  On interview he absolutely denies that he was trying to harm himself.  Denies any suicidal thoughts at all.  Said his mood recently has been fairly good.  Does not feel depressed.  Sleeps well at night.  No psychotic symptoms no problems with anger.  Denies any major stresses at home or anything else that would make him suicidal.  He says that he suspects that he just accidentally got a particularly potent or perhaps fentanyl laced load of heroin this time.  Patient says that he has been shooting up heroin regularly for probably about a year.  Uses marijuana as well.  Does not drink regularly.  Social history: Lives with his parents.  Does not have insurance.  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  Medical history: No significant medical problems not on any prescription medicine  Substance abuse history: Has had substance abuse problems for quite a while.  Started smoking pot when he was very young.  Still uses  marijuana regularly.  Has used pills in the past but started up shooting heroin about a year ago.  Never been involved in any kind of formal substance abuse treatment.  Past Psychiatric History: Denies any psychiatric treatment.  Never seen a mental health provider.  No psychiatric medication no history of suicide attempts.  Risk to Self:   Risk to Others:   Prior Inpatient Therapy:   Prior Outpatient Therapy:    Past Medical History: History reviewed. No pertinent past medical history.  Past Surgical History:  Procedure Laterality Date  . TONSILLECTOMY     Family History: History reviewed. No pertinent family history. Family Psychiatric  History: Positive for alcohol abuse Social History:  Social History   Substance and Sexual Activity  Alcohol Use Yes   Comment: monthly     Social History   Substance and Sexual Activity  Drug Use Yes  . Types: Marijuana   Comment: heroin, xanax,    Social History   Socioeconomic History  . Marital status: Single    Spouse name: Not on file  . Number of children: Not on file  . Years of education: Not on file  . Highest education level: Not on file  Occupational History  . Not on file  Social Needs  . Financial resource strain: Not on file  . Food insecurity:    Worry: Not on file    Inability: Not on file  . Transportation needs:    Medical: Not on file  Non-medical: Not on file  Tobacco Use  . Smoking status: Never Smoker  . Smokeless tobacco: Never Used  Substance and Sexual Activity  . Alcohol use: Yes    Comment: monthly  . Drug use: Yes    Types: Marijuana    Comment: heroin, xanax,  . Sexual activity: Yes  Lifestyle  . Physical activity:    Days per week: Not on file    Minutes per session: Not on file  . Stress: Not on file  Relationships  . Social connections:    Talks on phone: Not on file    Gets together: Not on file    Attends religious service: Not on file    Active member of club or organization:  Not on file    Attends meetings of clubs or organizations: Not on file    Relationship status: Not on file  Other Topics Concern  . Not on file  Social History Narrative  . Not on file   Additional Social History:    Allergies:   Allergies  Allergen Reactions  . Amoxicillin Rash and Other (See Comments)    Has patient had a PCN reaction causing immediate rash, facial/tongue/throat swelling, SOB or lightheadedness with hypotension: Unknown Has patient had a PCN reaction causing severe rash involving mucus membranes or skin necrosis: Unknown Has patient had a PCN reaction that required hospitalization: Unknown Has patient had a PCN reaction occurring within the last 10 years: No If all of the above answers are "NO", then may proceed with Cephalosporin use.     Labs:  Results for orders placed or performed during the hospital encounter of 02/05/18 (from the past 48 hour(s))  Troponin I     Status: Abnormal   Collection Time: 02/05/18  7:44 PM  Result Value Ref Range   Troponin I 0.07 (HH) <0.03 ng/mL    Comment: CRITICAL RESULT CALLED TO, READ BACK BY AND VERIFIED WITH SHERRIE ALLSION AT 2051 02/05/2018.  TFK Performed at [facility], [address] [place], [place] [address]   CBC with Differential/Platelet     Status: Abnormal   Collection Time: 02/05/18  7:44 PM  Result Value Ref Range   WBC 23.2 (H) 3.8 - 10.6 K/uL   RBC 4.39 (L) 4.40 - 5.90 MIL/uL   Hemoglobin 12.7 (L) 13.0 - 18.0 g/dL   HCT 38.7 (L) 40.0 - 52.0 %   MCV 88.1 80.0 - 100.0 fL   MCH 28.9 26.0 - 34.0 pg   MCHC 32.8 32.0 - 36.0 g/dL   RDW 13.4 11.5 - 14.5 %   Platelets 271 150 - 440 K/uL   Neutrophils Relative % 86 %   Neutro Abs 20.0 (H) 1.4 - 6.5 K/uL   Lymphocytes Relative 6 %   Lymphs Abs 1.4 1.0 - 3.6 K/uL   Monocytes Relative 8 %   Monocytes Absolute 1.7 (H) 0.2 - 1.0 K/uL   Eosinophils Relative 0 %   Eosinophils Absolute 0.0 0 - 0.7 K/uL   Basophils Relative 0 %   Basophils  Absolute 0.0 0 - 0.1 K/uL    Comment: Performed at [facility], [address], [place],  [address]  Comprehensive metabolic panel     Status: Abnormal   Collection Time: 02/05/18  7:44 PM  Result Value Ref Range   Sodium 141 135 - 145 mmol/L   Potassium 4.7 3.5 - 5.1 mmol/L   Chloride 106 98 - 111 mmol/L   CO2 28 22 - 32 mmol/L  Glucose, Bld 130 (H) 70 - 99 mg/dL   BUN 20 6 - 20 mg/dL   Creatinine, Ser 0.97 0.61 - 1.24 mg/dL   Calcium 8.9 8.9 - 10.3 mg/dL   Total Protein 7.2 6.5 - 8.1 g/dL   Albumin 4.2 3.5 - 5.0 g/dL   AST 147 (H) 15 - 41 U/L   ALT 131 (H) 0 - 44 U/L   Alkaline Phosphatase 65 38 - 126 U/L   Total Bilirubin 0.3 0.3 - 1.2 mg/dL   GFR calc non Af Amer >60 >60 mL/min   GFR calc Af Amer >60 >60 mL/min    Comment: (NOTE) The eGFR has been calculated using the CKD EPI equation. This calculation has not been validated in all clinical situations. eGFR's persistently <60 mL/min signify possible Chronic Kidney Disease.    Anion gap 7 5 - 15    Comment: Performed at [facility], [address] [place], [place] [address]  CK     Status: Abnormal   Collection Time: 02/05/18  7:44 PM  Result Value Ref Range   Total CK 492 (H) 49 - 397 U/L    Comment: Performed at [facility], [address] [place], [place] [address]  Blood culture (routine x 2)     Status: None (Preliminary result)   Collection Time: 02/05/18  9:14 PM  Result Value Ref Range   Specimen Description BLOOD BLOOD RIGHT WRIST    Special Requests      BOTTLES DRAWN AEROBIC AND ANAEROBIC Blood Culture results may not be optimal due to an excessive volume of blood received in culture bottles   Culture      NO GROWTH < 12 HOURS Performed at [facility], [address] [place], [place] [address]    Report Status PENDING   Blood culture (routine x 2)     Status: None (Preliminary result)   Collection Time: 02/05/18  9:14 PM  Result Value Ref Range    Specimen Description BLOOD BLOOD RIGHT HAND    Special Requests      BOTTLES DRAWN AEROBIC AND ANAEROBIC Blood Culture adequate volume   Culture      NO GROWTH < 12 HOURS Performed at [facility], [address] [place], [place] [address]    Report Status PENDING   Urine Drug Screen, Qualitative ([place] only)     Status: Abnormal   Collection Time: 02/05/18 10:40 PM  Result Value Ref Range   Tricyclic, Ur Screen NONE DETECTED NONE DETECTED   Amphetamines, Ur Screen NONE DETECTED NONE DETECTED   MDMA (Ecstasy)Ur Screen NONE DETECTED NONE DETECTED   Cocaine Metabolite,Ur [place] POSITIVE (A) NONE DETECTED   Opiate, Ur Screen POSITIVE (A) NONE DETECTED   Phencyclidine (PCP) Ur S NONE DETECTED NONE DETECTED   Cannabinoid 50 Ng, Ur [place] POSITIVE (A) NONE DETECTED   Barbiturates, Ur Screen NONE DETECTED NONE DETECTED   Benzodiazepine, Ur Scrn POSITIVE (A) NONE DETECTED   Methadone Scn, Ur NONE DETECTED NONE DETECTED    Comment: (NOTE) Tricyclics + metabolites, urine    Cutoff 1000 ng/mL Amphetamines + metabolites, urine  Cutoff 1000 ng/mL MDMA (Ecstasy), urine              Cutoff 500 ng/mL Cocaine Metabolite, urine          Cutoff 300 ng/mL Opiate + metabolites, urine        Cutoff 300 ng/mL Phencyclidine (PCP), urine         Cutoff 25  ng/mL Cannabinoid, urine                 Cutoff 50 ng/mL Barbiturates + metabolites, urine  Cutoff 200 ng/mL Benzodiazepine, urine              Cutoff 200 ng/mL Methadone, urine                   Cutoff 300 ng/mL The urine drug screen provides only a preliminary, unconfirmed analytical test result and should not be used for non-medical purposes. Clinical consideration and professional judgment should be applied to any positive drug screen result due to possible interfering substances. A more specific alternate chemical method must be used in order to obtain a confirmed analytical result. Gas chromatography / mass spectrometry (GC/MS) is the  preferred confirmat ory method. Performed at [facility], [address] [place], [place] [address]   Troponin I     Status: Abnormal   Collection Time: 02/05/18 10:40 PM  Result Value Ref Range   Troponin I 0.16 (HH) <0.03 ng/mL    Comment: CRITICAL VALUE NOTED. VALUE IS CONSISTENT WITH PREVIOUSLY REPORTED/CALLED VALUE / FLC Performed at [facility], [address] [place], [place] [address]   Lactic acid, plasma     Status: None   Collection Time: 02/05/18 10:40 PM  Result Value Ref Range   Lactic Acid, Venous 1.6 0.5 - 1.9 mmol/L    Comment: Performed at [facility], [place], [place] [address]  Heparin level (unfractionated)     Status: None   Collection Time: 02/06/18  8:41 AM  Result Value Ref Range   Heparin Unfractionated 0.37 0.30 - 0.70 IU/mL    Comment: (NOTE) If heparin results are below expected values, and patient dosage has  been confirmed, suggest follow up testing of antithrombin III levels. Performed at [facility], [address] [place], [place] [address]   CBC     Status: Abnormal   Collection Time: 02/06/18  8:41 AM  Result Value Ref Range   WBC 14.6 (H) 3.8 - 10.6 K/uL   RBC 4.51 4.40 - 5.90 MIL/uL   Hemoglobin 13.4 13.0 - 18.0 g/dL   HCT 39.7 (L) 40.0 - 52.0 %   MCV 88.1 80.0 - 100.0 fL   MCH 29.7 26.0 - 34.0 pg   MCHC 33.8 32.0 - 36.0 g/dL   RDW 13.5 11.5 - 14.5 %   Platelets 238 150 - 440 K/uL    Comment: Performed at [facility], [address] [place], [place] [address]  Basic metabolic panel     Status: None   Collection Time: 02/06/18  8:41 AM  Result Value Ref Range   Sodium 139 135 - 145 mmol/L   Potassium 4.3 3.5 - 5.1 mmol/L   Chloride 103 98 - 111 mmol/L   CO2 26 22 - 32 mmol/L   Glucose, Bld 94 70 - 99 mg/dL   BUN 16 6 - 20 mg/dL   Creatinine, Ser 0.85 0.61 - 1.24 mg/dL   Calcium 8.9 8.9 - 10.3 mg/dL   GFR calc non Af Amer >60 >60 mL/min   GFR calc Af Amer  >60 >60 mL/min    Comment: (NOTE) The eGFR has been calculated using the CKD EPI equation. This calculation has not been validated in all clinical situations. eGFR's persistently <60 mL/min signify possible Chronic Kidney Disease.    Anion gap 10 5 - 15    Comment: Performed at  [facility], [address] [place], [place] [address]  Protime-INR     Status: None   Collection Time: 02/06/18  8:41 AM  Result Value Ref Range   Prothrombin Time 13.8 11.4 - 15.2 seconds   INR 1.07     Comment: Performed at [facility], [address] [place], [place] [address]  APTT     Status: Abnormal   Collection Time: 02/06/18  8:41 AM  Result Value Ref Range   aPTT 87 (H) 24 - 36 seconds    Comment:        IF BASELINE aPTT IS ELEVATED, SUGGEST PATIENT RISK ASSESSMENT BE USED TO DETERMINE APPROPRIATE ANTICOAGULANT THERAPY. Performed at [facility], [place], [place] [address]   Troponin I (q 6hr x 3)     Status: Abnormal   Collection Time: 02/06/18 11:56 AM  Result Value Ref Range   Troponin I 0.46 (HH) <0.03 ng/mL    Comment: CRITICAL VALUE NOTED. VALUE IS CONSISTENT WITH PREVIOUSLY REPORTED/CALLED VALUE DAS Performed at [facility], [place], [place] [address]   Heparin level (unfractionated)     Status: Abnormal   Collection Time: 02/06/18  2:42 PM  Result Value Ref Range   Heparin Unfractionated 0.27 (L) 0.30 - 0.70 IU/mL    Comment: (NOTE) If heparin results are below expected values, and patient dosage has  been confirmed, suggest follow up testing of antithrombin III levels. Performed at [facility], [address] [place], [place] [address]   Troponin I (q 6hr x 3)     Status: Abnormal   Collection Time: 02/06/18  5:52 PM  Result Value Ref Range   Troponin I 0.36 (HH) <0.03 ng/mL    Comment: CRITICAL VALUE NOTED. VALUE IS CONSISTENT WITH PREVIOUSLY REPORTED/CALLED VALUE KLW Performed at  [facility], [place], [place] [address]   Heparin level (unfractionated)     Status: None   Collection Time: 02/06/18  7:33 PM  Result Value Ref Range   Heparin Unfractionated 0.39 0.30 - 0.70 IU/mL    Comment: (NOTE) If heparin results are below expected values, and patient dosage has  been confirmed, suggest follow up testing of antithrombin III levels. Performed at [facility], [address], [place], [place] [address]     Current Facility-Administered Medications  Medication Dose Route Frequency Provider Last Rate Last Dose  . 0.9 %  sodium chloride infusion   Intravenous Continuous Amelia Jo, MD 100 mL/hr at 02/06/18 1800    . acetaminophen (TYLENOL) tablet 650 mg  650 mg Oral Q6H PRN Amelia Jo, MD       Or  . acetaminophen (TYLENOL) suppository 650 mg  650 mg Rectal Q6H PRN Amelia Jo, MD      . aspirin EC tablet 81 mg  81 mg Oral Daily Amelia Jo, MD   81 mg at 02/06/18 1051  . bisacodyl (DULCOLAX) EC tablet 5 mg  5 mg Oral Daily PRN Amelia Jo, MD      . Derrill Memo ON 02/07/2018] buprenorphine-naloxone (SUBOXONE) 2-0.5 mg per SL tablet 2 tablet  2 tablet Sublingual BID Halona Amstutz T, MD      . docusate sodium (COLACE) capsule 100 mg  100 mg Oral BID Amelia Jo, MD      . flavoxATE Bard Herbert) tablet 200 mg  200 mg Oral Once Darel Hong, MD      . folic acid (FOLVITE) tablet 1 mg  1  mg Oral Daily Pyreddy, Pavan, MD   1 mg at 02/06/18 1238  . heparin ADULT infusion 100 units/mL (25000 units/273m sodium chloride 0.45%)  1,250 Units/hr Intravenous Continuous Hallaji, Sheema M, RPH 12.5 mL/hr at 02/06/18 1936 1,250 Units/hr at 02/06/18 1936  . HYDROcodone-acetaminophen (NORCO/VICODIN) 5-325 MG per tablet 1-2 tablet  1-2 tablet Oral Q4H PRN MAmelia Jo MD   2 tablet at 02/06/18 1715  . LORazepam (ATIVAN) tablet 1 mg  1 mg Oral Q6H PRN PSaundra Shelling MD       Or  . LORazepam (ATIVAN) injection 1 mg  1 mg Intravenous Q6H PRN  Pyreddy, Pavan, MD      . LORazepam (ATIVAN) tablet 0-4 mg  0-4 mg Oral Q6H Pyreddy, Pavan, MD   2 mg at 02/06/18 1756   Followed by  . [START ON 02/08/2018] LORazepam (ATIVAN) tablet 0-4 mg  0-4 mg Oral Q12H Pyreddy, Pavan, MD      . multivitamin with minerals tablet 1 tablet  1 tablet Oral Daily Pyreddy, Pavan, MD   1 tablet at 02/06/18 1239  . naloxone ([facility] injection 0.2 mg  0.2 mg Intravenous Once RMerlyn Lot MD      . ondansetron ([place] tablet 4 mg  4 mg Oral Q6H PRN MAmelia Jo MD       Or  . ondansetron ([facility] injection 4 mg  4 mg Intravenous Q6H PRN MAmelia Jo MD      . thiamine (VITAMIN B-1) tablet 100 mg  100 mg Oral Daily Pyreddy, Pavan, MD   100 mg at 02/06/18 1238   Or  . thiamine (B-1) injection 100 mg  100 mg Intravenous Daily Pyreddy, PReatha Harps MD        Musculoskeletal: Strength & Muscle Tone: within normal limits Gait & Station: normal Patient leans: N/A  Psychiatric Specialty Exam: Physical Exam  Nursing note and vitals reviewed. Constitutional: He appears well-developed and well-nourished.  HENT:  Head: Normocephalic and atraumatic.  Eyes: Pupils are equal, round, and reactive to light. Conjunctivae are normal.  Neck: Normal range of motion.  Cardiovascular: Regular rhythm and normal heart sounds.  Respiratory: Effort normal. No respiratory distress.  GI: Soft.  Musculoskeletal: Normal range of motion.  Neurological: He is alert.  Skin: Skin is warm and dry.  Psychiatric: He has a normal mood and affect. His speech is normal and behavior is normal. Judgment and thought content normal. Cognition and memory are normal.    Review of Systems  Constitutional: Negative.   HENT: Negative.   Eyes: Negative.   Respiratory: Negative.   Cardiovascular: Negative.   Gastrointestinal: Positive for abdominal pain and nausea.  Musculoskeletal: Negative.   Skin: Negative.   Neurological: Negative.   Psychiatric/Behavioral: Positive for substance  abuse. Negative for depression, hallucinations, memory loss and suicidal ideas. The patient is not nervous/anxious and does not have insomnia.     Blood pressure (!) 139/96, pulse 80, temperature 98.2 F (36.8 C), temperature source Oral, resp. rate 18, height _0  (1.93 m), weight 83.9 kg, SpO2 100 %.Body mass index is 22.52 kg/m.  General Appearance: Casual  Eye Contact:  Good  Speech:  Clear and Coherent  Volume:  Normal  Mood:  Euthymic  Affect:  Constricted  Thought Process:  Goal Directed  Orientation:  Full (Time, Place, and Person)  Thought Content:  Logical  Suicidal Thoughts:  No  Homicidal Thoughts:  No  Memory:  Immediate;   Fair Recent;   Fair Remote;   Fair  Judgement:  Fair  Insight:  Fair  Psychomotor Activity:  Decreased  Concentration:  Concentration: Fair  Recall:  [employer] of Knowledge:  Fair  Language:  Fair  Akathisia:  No  Handed:  Right  AIMS (if indicated):     Assets:  Desire for Improvement Housing Physical Health Resilience Social Support  ADL's:  Intact  Cognition:  WNL  Sleep:        Treatment Plan Summary: Medication management and Plan 27 year old man with opiate dependence.  There is no evidence that he was trying to hurt himself.  Does not appear to have any symptoms of depression.  Seems very positive and optimistic in his outlook.  Does not require the suicide precautions or the sitter anymore.  He is complaining of typical symptoms of opiate withdrawal and is feeling very jittery all over.  Parents were concerned that he might be feeling bad enough that he would try to leave AMA.  I am going to give him some Suboxone one-time dose this evening and then twice a day for the next couple days.  Explained to the patient that even with this he will have withdrawal likely when he leaves the hospital but this can keep him more comfortable for the next couple days.  Patient completely agreeable to the plan.  I will follow-up as needed.   Psychoeducation completed.  Encourage patient and plans to get involved in substance abuse treatment  Disposition: No evidence of imminent risk to self or others at present.   Patient does not meet criteria for psychiatric inpatient admission. Supportive therapy provided about ongoing stressors.  Alethia Berthold, MD 02/06/2018 8:23 PM

## 2018-02-06 NOTE — Progress Notes (Signed)
Advanced care plan.  Purpose of the Encounter: CODE STATUS  Parties in Attendance:Patient  Patient's Decision Capacity:Good  Subjective/Patient's story: Presented for drug over dose   Objective/Medical story Has elevated troponins Has substance abuse   Goals of care determination:  Advance care directives and goals of care discussed Treatment plan discussed Patient wants everything done which includes cpr,intubation if need arises.   CODE STATUS: Full code   Time spent discussing advanced care planning: 16 minutes

## 2018-02-06 NOTE — Progress Notes (Signed)
ANTICOAGULATION CONSULT NOTE - Initial Consult  Pharmacy Consult for heparin Indication: chest pain/ACS  Allergies  Allergen Reactions  . Amoxicillin Rash and Other (See Comments)    Has patient had a PCN reaction causing immediate rash, facial/tongue/throat swelling, SOB or lightheadedness with hypotension: Unknown Has patient had a PCN reaction causing severe rash involving mucus membranes or skin necrosis: Unknown Has patient had a PCN reaction that required hospitalization: Unknown Has patient had a PCN reaction occurring within the last 10 years: No If all of the above answers are "NO", then may proceed with Cephalosporin use.     Patient Measurements: Height: 6\' 4"  (193 cm) Weight: 185 lb (83.9 kg) IBW/kg (Calculated) : 86.8 Heparin Dosing Weight: 84 kg  Vital Signs: Temp: 98.2 F (36.8 C) (09/12 2001) Temp Source: Oral (09/12 2001) BP: 139/96 (09/12 2001) Pulse Rate: 80 (09/12 2001)  Labs: Recent Labs    02/05/18 1944 02/05/18 2240 02/06/18 0841 02/06/18 1156 02/06/18 1442 02/06/18 1752 02/06/18 1933  HGB 12.7*  --  13.4  --   --   --   --   HCT 38.7*  --  39.7*  --   --   --   --   PLT 271  --  238  --   --   --   --   APTT  --   --  87*  --   --   --   --   LABPROT  --   --  13.8  --   --   --   --   INR  --   --  1.07  --   --   --   --   HEPARINUNFRC  --   --  0.37  --  0.27*  --  0.39  CREATININE 0.97  --  0.85  --   --   --   --   CKTOTAL 492*  --   --   --   --   --   --   TROPONINI 0.07* 0.16*  --  0.46*  --  0.36*  --     Estimated Creatinine Clearance: 154.9 mL/min (by C-G formula based on SCr of 0.85 mg/dL).   Medical History: History reviewed. No pertinent past medical history.  Medications:  Scheduled:  . aspirin EC  81 mg Oral Daily  . [START ON 02/07/2018] buprenorphine-naloxone  2 tablet Sublingual BID  . docusate sodium  100 mg Oral BID  . flavoxATE  200 mg Oral Once  . folic acid  1 mg Oral Daily  . LORazepam  0-4 mg Oral Q6H   Followed by  . [START ON 02/08/2018] LORazepam  0-4 mg Oral Q12H  . multivitamin with minerals  1 tablet Oral Daily  . naLOXone (NARCAN)  injection  0.2 mg Intravenous Once  . thiamine  100 mg Oral Daily   Or  . thiamine  100 mg Intravenous Daily    Assessment: Patient admitted s/t drug overdose from heroin that was apparently cut w/ fentanyl. Patient has initial trops of 0.07 >> 0.16. Patient does not take any meds or anticoagulants. EKG shows borderline ST elevation Patient is being started on heparin drip for acute infarct  9/12 AM: HL 0.37. Level is therapeutic x 1. Heparin infusing @ 1000 units/hr.   Goal of Therapy:  Heparin level 0.3-0.7 units/ml Monitor platelets by anticoagulation protocol: Yes   Plan:  9/12 1400: HL 0.27. Level is subtherapeutic.  Will increase heparin infusion to 1250  units/hr  Recheck HL in 6 hours. Will monitor daily CBC's and adjust per HL's  9/12:  HL @ 19:33 = 0.39 Will continue pt on current rate and recheck HL in 6 hrs on 9/13 @ 0200.   Scherrie Gerlachobbins,Shaye Elling D, PharmD Clinical Pharmacist 02/06/2018 9:09 PM

## 2018-02-06 NOTE — ED Notes (Signed)
Pt actively vomiting. New orders received. Told pt that he was NPO for now.

## 2018-02-06 NOTE — ED Notes (Signed)
MD stated pt could have water. As soon as pt drank water, he immediately began to vomit. Pt has approx of clear emesis in bag. Took water from pt and returned him to NPO status. Meds given.

## 2018-02-06 NOTE — ED Notes (Signed)
Bladder scan shows 790 ml in

## 2018-02-06 NOTE — Progress Notes (Signed)
ANTICOAGULATION CONSULT NOTE - Initial Consult  Pharmacy Consult for heparin Indication: chest pain/ACS  Allergies  Allergen Reactions  . Amoxicillin Rash and Other (See Comments)    Has patient had a PCN reaction causing immediate rash, facial/tongue/throat swelling, SOB or lightheadedness with hypotension: Unknown Has patient had a PCN reaction causing severe rash involving mucus membranes or skin necrosis: Unknown Has patient had a PCN reaction that required hospitalization: Unknown Has patient had a PCN reaction occurring within the last 10 years: No If all of the above answers are "NO", then may proceed with Cephalosporin use.     Patient Measurements: Height: 6\' 4"  (193 cm) Weight: 185 lb (83.9 kg) IBW/kg (Calculated) : 86.8 Heparin Dosing Weight: 84 kg  Vital Signs: Temp: 98.2 F (36.8 C) (09/12 0836) Temp Source: Oral (09/12 0836) BP: 128/93 (09/12 0836) Pulse Rate: 91 (09/12 0836)  Labs: Recent Labs    02/05/18 1944 02/05/18 2240 02/06/18 0841 02/06/18 1156 02/06/18 1442  HGB 12.7*  --  13.4  --   --   HCT 38.7*  --  39.7*  --   --   PLT 271  --  238  --   --   APTT  --   --  87*  --   --   LABPROT  --   --  13.8  --   --   INR  --   --  1.07  --   --   HEPARINUNFRC  --   --  0.37  --  0.27*  CREATININE 0.97  --  0.85  --   --   CKTOTAL 492*  --   --   --   --   TROPONINI 0.07* 0.16*  --  0.46*  --     Estimated Creatinine Clearance: 154.9 mL/min (by C-G formula based on SCr of 0.85 mg/dL).   Medical History: History reviewed. No pertinent past medical history.  Medications:  Scheduled:  . aspirin EC  81 mg Oral Daily  . docusate sodium  100 mg Oral BID  . flavoxATE  200 mg Oral Once  . folic acid  1 mg Oral Daily  . LORazepam  0-4 mg Oral Q6H   Followed by  . [START ON 02/08/2018] LORazepam  0-4 mg Oral Q12H  . multivitamin with minerals  1 tablet Oral Daily  . naLOXone (NARCAN)  injection  0.2 mg Intravenous Once  . thiamine  100 mg Oral  Daily   Or  . thiamine  100 mg Intravenous Daily    Assessment: Patient admitted s/t drug overdose from heroin that was apparently cut w/ fentanyl. Patient has initial trops of 0.07 >> 0.16. Patient does not take any meds or anticoagulants. EKG shows borderline ST elevation Patient is being started on heparin drip for acute infarct  9/12 AM: HL 0.37. Level is therapeutic x 1. Heparin infusing @ 1000 units/hr.   Goal of Therapy:  Heparin level 0.3-0.7 units/ml Monitor platelets by anticoagulation protocol: Yes   Plan:  9/12 1400: HL 0.27. Level is subtherapeutic.  Will increase heparin infusion to 1250 units/hr  Recheck HL in 6 hours. Will monitor daily CBC's and adjust per HL's  Gardner CandleSheema M Shemuel Harkleroad, PharmD, BCPS Clinical Pharmacist 02/06/2018 3:06 PM

## 2018-02-06 NOTE — ED Notes (Signed)
Pt mother called an updated.

## 2018-02-06 NOTE — H&P (Addendum)
[facility] - [place] at [facility]   PATIENT NAME: Christopher Stein    MR#:  [id number]  DATE OF BIRTH:  07/18/1990  DATE OF ADMISSION:  02/05/2018  PRIMARY CARE PHYSICIAN: Patient, No Pcp Per   REQUESTING/REFERRING PHYSICIAN:   CHIEF COMPLAINT:   Chief Complaint  Patient presents with  . Drug Overdose    HISTORY OF PRESENT ILLNESS: Christopher Stein  is a 27 y.o. male with a known history of heroin abuse. Patient was brought to the emergency room for accidental heroin overdose.  He admits that he uses heroin weekly and he injected his normal amount yesterday, but became very lethargic.  Patient suspects that this time the heroin was mixed with fentanyl.  He was found unresponsive by the family and brought via EMS to the hospital. Patient is currently very drowsy, unable to provide further reliable history.  He denies however any chest pain. Labs done emergency room are notable for elevated WBC at 23.2.  Troponin level is elevated at 0.07. EKG shows sinus tachycardia with heart rate at 110.  There are delta waves in V3, V4 and V5 noted. Patient is admitted for further evaluation and treatment.  PAST MEDICAL HISTORY:  History reviewed. No pertinent past medical history.  PAST SURGICAL HISTORY:  Past Surgical History:  Procedure Laterality Date  . TONSILLECTOMY      SOCIAL HISTORY:  Social History   Tobacco Use  . Smoking status: Never Smoker  . Smokeless tobacco: Never Used  Substance Use Topics  . Alcohol use: Yes    Comment: monthly    FAMILY HISTORY: No family history on file.  DRUG ALLERGIES:  Allergies  Allergen Reactions  . Amoxicillin Rash and Other (See Comments)    Has patient had a PCN reaction causing immediate rash, facial/tongue/throat swelling, SOB or lightheadedness with hypotension: Unknown Has patient had a PCN reaction causing severe rash involving mucus membranes or skin necrosis: Unknown Has patient had a PCN reaction that required  hospitalization: Unknown Has patient had a PCN reaction occurring within the last 10 years: No If all of the above answers are "NO", then may proceed with Cephalosporin use.     REVIEW OF SYSTEMS:   Unable to obtain reliable review of systems due to patient's drowsiness.  MEDICATIONS AT HOME:  Prior to Admission medications   Not on File      PHYSICAL EXAMINATION:   VITAL SIGNS: Blood pressure 112/90, pulse (!) 106, temperature 97.7 F (36.5 C), temperature source Oral, resp. rate 14, height 6\' 4"  (1.93 m), weight 83.9 kg, SpO2 100 %.  GENERAL:  27 y.o.-year-old patient lying in the bed with no acute distress.  EYES: Pupils are pinpoint. No scleral icterus.  HEENT: Head atraumatic, normocephalic. Oropharynx and nasopharynx clear.  NECK:  Supple, no jugular venous distention. No thyroid enlargement, no tenderness.  LUNGS: Reduced breath sounds bilaterally, no wheezing, rales,rhonchi or crepitation. No use of accessory muscles of respiration.  CARDIOVASCULAR: S1, S2 normal. No S3/S4.  ABDOMEN: Soft, nontender, nondistended. Bowel sounds present. No organomegaly or mass.  EXTREMITIES: No pedal edema, cyanosis, or clubbing.  NEUROLOGIC: Patient is moving all his extremities, no focal weakness appreciated PSYCHIATRIC: The patient is very drowsy.  SKIN: No obvious rash, lesion, or ulcer.   LABORATORY PANEL:   CBC Recent Labs  Lab 02/05/18 1944  WBC 23.2*  HGB 12.7*  HCT 38.7*  PLT 271  MCV 88.1  MCH 28.9  MCHC 32.8  RDW 13.4  LYMPHSABS 1.4  MONOABS 1.7*  EOSABS 0.0  BASOSABS 0.0   ------------------------------------------------------------------------------------------------------------------  Chemistries  Recent Labs  Lab 02/05/18 1944  NA 141  K 4.7  CL 106  CO2 28  GLUCOSE 130*  BUN 20  CREATININE 0.97  CALCIUM 8.9  AST 147*  ALT 131*  ALKPHOS 65  BILITOT 0.3    ------------------------------------------------------------------------------------------------------------------ estimated creatinine clearance is 135.7 mL/min (by C-G formula based on SCr of 0.97 mg/dL). ------------------------------------------------------------------------------------------------------------------ No results for input(s): TSH, T4TOTAL, T3FREE, THYROIDAB in the last 72 hours.  Invalid input(s): FREET3   Coagulation profile No results for input(s): INR, PROTIME in the last 168 hours. ------------------------------------------------------------------------------------------------------------------- No results for input(s): DDIMER in the last 72 hours. -------------------------------------------------------------------------------------------------------------------  Cardiac Enzymes Recent Labs  Lab 02/05/18 1944 02/05/18 2240  TROPONINI 0.07* 0.16*   ------------------------------------------------------------------------------------------------------------------ Invalid input(s): POCBNP  ---------------------------------------------------------------------------------------------------------------  Urinalysis No results found for: COLORURINE, APPEARANCEUR, LABSPEC, PHURINE, GLUCOSEU, HGBUR, BILIRUBINUR, KETONESUR, PROTEINUR, UROBILINOGEN, NITRITE, LEUKOCYTESUR   RADIOLOGY: Dg Chest 2 View  Result Date: 02/05/2018 CLINICAL DATA:  Overdose EXAM: CHEST - 2 VIEW COMPARISON:  None. FINDINGS: The heart size and mediastinal contours are within normal limits. There is no focal infiltrate, pulmonary edema, or pleural effusion. The visualized skeletal structures are unremarkable. IMPRESSION: No active cardiopulmonary disease. Electronically Signed   By: Sherian Rein M.D.   On: 02/05/2018 21:07   Ct Head Wo Contrast  Result Date: 02/05/2018 CLINICAL DATA:  Heroin overdose question containing fentanyl, found on bathroom floor by family EXAM: CT HEAD WITHOUT CONTRAST  TECHNIQUE: Contiguous axial images were obtained from the base of the skull through the vertex without intravenous contrast. Sagittal and coronal MPR images reconstructed from axial data set. COMPARISON:  None FINDINGS: Brain: Normal ventricular morphology. No midline shift or mass effect. Normal appearance of brain parenchyma. No intracranial hemorrhage, mass lesion, evidence of acute infarction, or extra-axial fluid collection. Vascular: No hyperdense vessels Skull: Intact Sinuses/Orbits: Clear Other: N/A IMPRESSION: Normal exam. Electronically Signed   By: Ulyses Southward M.D.   On: 02/05/2018 23:27    EKG: Orders placed or performed during the hospital encounter of 02/05/18  . EKG 12-Lead  . EKG 12-Lead  . ED EKG  . ED EKG  . EKG 12-Lead  . EKG 12-Lead  . EKG 12-Lead  . EKG 12-Lead    IMPRESSION AND PLAN:  1.  Accidental heroin overdose 2.  Abnormal EKG 3.  Elevated troponin level 4.  Leukocytosis 5.  Acute encephalopathy, secondary to drug overdose  -Given the leukocytosis, elevated troponin level and EKG changes, it is possible the patient has myocarditis. -We will continue to monitor on telemetry and follow troponin levels.   -We will start patient on aspirin and heparin drip. -Will check 2D echo for further evaluation of the heart function. -Cardiologist consulted for further evaluation and treatment. -We will monitor patient clinically closely for heroine withdrawal symptoms.  Psychiatry is consulted.   All the records are reviewed and case discussed with ED provider. Management plans discussed with the patient, who is in agreement.  CODE STATUS: Full    TOTAL TIME TAKING CARE OF THIS PATIENT: 45 minutes.    Cammy Copa M.D on 02/06/2018 at 12:53 AM  Between 7am to 6pm - Pager - [phone]  After 6pm go to [email or web address] - password [email or web address] [facility] [place] at [facility]  [phone]  CC: Primary care physician; Patient,  No Pcp Per

## 2018-02-06 NOTE — Progress Notes (Signed)
[facility] - [place] at [facility]   PATIENT NAME: Christopher Stein    MR#:  [id number]  DATE OF BIRTH:  11/04/1990  SUBJECTIVE:  CHIEF COMPLAINT:   Chief Complaint  Patient presents with  . Drug Overdose  Patient seen and evaluated today Patient's mother was at bedside According to family's information patient overdosed  intentionally He had some thoughts of hurting himself Patient has long-term substance abuse  REVIEW OF SYSTEMS:    ROS  CONSTITUTIONAL: No documented fever. No fatigue, weakness. No weight gain, no weight loss.  EYES: No blurry or double vision.  ENT: No tinnitus. No postnasal drip. No redness of the oropharynx.  RESPIRATORY: No cough, no wheeze, no hemoptysis. No dyspnea.  CARDIOVASCULAR: No chest pain. No orthopnea. No palpitations. No syncope.  GASTROINTESTINAL: No nausea, no vomiting or diarrhea. No abdominal pain. No melena or hematochezia.  GENITOURINARY: No dysuria or hematuria.  ENDOCRINE: No polyuria or nocturia. No heat or cold intolerance.  HEMATOLOGY: No anemia. No bruising. No bleeding.  INTEGUMENTARY: No rashes. No lesions.  MUSCULOSKELETAL: No arthritis. No swelling. No gout.  NEUROLOGIC: No numbness, tingling, or ataxia. No seizure-type activity.  PSYCHIATRIC: No anxiety. No insomnia. No ADD.   DRUG ALLERGIES:   Allergies  Allergen Reactions  . Amoxicillin Rash and Other (See Comments)    Has patient had a PCN reaction causing immediate rash, facial/tongue/throat swelling, SOB or lightheadedness with hypotension: Unknown Has patient had a PCN reaction causing severe rash involving mucus membranes or skin necrosis: Unknown Has patient had a PCN reaction that required hospitalization: Unknown Has patient had a PCN reaction occurring within the last 10 years: No If all of the above answers are "NO", then may proceed with Cephalosporin use.     VITALS:  Blood pressure (!) 128/93, pulse 91, temperature 98.2 F (36.8 C),  temperature source Oral, resp. rate 18, height 6\' 4"  (1.93 m), weight 83.9 kg, SpO2 100 %.  PHYSICAL EXAMINATION:   Physical Exam  GENERAL:  27 y.o.-year-old patient lying in the bed with no acute distress.  EYES: Pupils equal, round, reactive to light and accommodation. No scleral icterus. Extraocular muscles intact.  HEENT: Head atraumatic, normocephalic. Oropharynx and nasopharynx clear.  NECK:  Supple, no jugular venous distention. No thyroid enlargement, no tenderness.  LUNGS: Normal breath sounds bilaterally, no wheezing, rales, rhonchi. No use of accessory muscles of respiration.  CARDIOVASCULAR: S1, S2 normal. No murmurs, rubs, or gallops.  ABDOMEN: Soft, nontender, nondistended. Bowel sounds present. No organomegaly or mass.  EXTREMITIES: No cyanosis, clubbing or edema b/l.    NEUROLOGIC: Cranial nerves II through XII are intact. No focal Motor or sensory deficits b/l.   PSYCHIATRIC: The patient is alert and oriented x 3.  Mood pleasant. SKIN: No obvious rash, lesion, or ulcer.   LABORATORY PANEL:   CBC Recent Labs  Lab 02/06/18 0841  WBC 14.6*  HGB 13.4  HCT 39.7*  PLT 238   ------------------------------------------------------------------------------------------------------------------ Chemistries  Recent Labs  Lab 02/05/18 1944 02/06/18 0841  NA 141 139  K 4.7 4.3  CL 106 103  CO2 28 26  GLUCOSE 130* 94  BUN 20 16  CREATININE 0.97 0.85  CALCIUM 8.9 8.9  AST 147*  --   ALT 131*  --   ALKPHOS 65  --   BILITOT 0.3  --    ------------------------------------------------------------------------------------------------------------------  Cardiac Enzymes Recent Labs  Lab 02/06/18 1156  TROPONINI 0.46*   ------------------------------------------------------------------------------------------------------------------  RADIOLOGY:  Dg Chest 2 View  Result  Date: 02/05/2018 CLINICAL DATA:  Overdose EXAM: CHEST - 2 VIEW COMPARISON:  None. FINDINGS: The  heart size and mediastinal contours are within normal limits. There is no focal infiltrate, pulmonary edema, or pleural effusion. The visualized skeletal structures are unremarkable. IMPRESSION: No active cardiopulmonary disease. Electronically Signed   By: Sherian ReinWei-Chen  Lin M.D.   On: 02/05/2018 21:07   Ct Head Wo Contrast  Result Date: 02/05/2018 CLINICAL DATA:  Heroin overdose question containing fentanyl, found on bathroom floor by family EXAM: CT HEAD WITHOUT CONTRAST TECHNIQUE: Contiguous axial images were obtained from the base of the skull through the vertex without intravenous contrast. Sagittal and coronal MPR images reconstructed from axial data set. COMPARISON:  None FINDINGS: Brain: Normal ventricular morphology. No midline shift or mass effect. Normal appearance of brain parenchyma. No intracranial hemorrhage, mass lesion, evidence of acute infarction, or extra-axial fluid collection. Vascular: No hyperdense vessels Skull: Intact Sinuses/Orbits: Clear Other: N/A IMPRESSION: Normal exam. Electronically Signed   By: Ulyses SouthwardMark  Boles M.D.   On: 02/05/2018 23:27     ASSESSMENT AND PLAN:  27 year old male patient with history of depression, substance abuse currently under hospitalist service for drug overdose with heroin mixed with fentanyl  -Drug overdose Has history of depression according to the family members Psychiatric consultation One-on-one observation for safety  -Acute encephalopathy secondary to drug overdose improved  -Elevated troponin Cardiology evaluation pending Cycle troponin On anticoagulation with heparin Continue aspirin Check echocardiogram  -DVT prophylaxis On anticoagulation with heparin  -Substance abuse Social worker follow-up as patient may need substance abuse resources   All the records are reviewed and case discussed with Care Management/Social Worker. Management plans discussed with the patient, family and they are in agreement.  CODE STATUS: Full  code  DVT Prophylaxis: SCDs  TOTAL TIME TAKING CARE OF THIS PATIENT: 35 minutes.   POSSIBLE D/C IN 2 to 3 DAYS, DEPENDING ON CLINICAL CONDITION.  Ihor AustinPavan Pyreddy M.D on 02/06/2018 at 1:07 PM  Between 7am to 6pm - Pager - [phone]  After 6pm go to [email or web address] - password [email or web address]  [facility] [place] [facility]  Office  [phone]  CC: Primary care physician; Patient, No Pcp Per  Note: This dictation was prepared with Dragon dictation along with smaller phrase technology. Any transcriptional errors that result from this process are unintentional.

## 2018-02-06 NOTE — Clinical Social Work Note (Signed)
CSW received consult that patient may need substance abuse resources.  CSW will discuss with patient.  Ervin KnackEric R. Ludy Messamore, MSW, Theresia MajorsLCSWA [phone]  02/06/2018 11:43 AM

## 2018-02-06 NOTE — Progress Notes (Signed)
*  PRELIMINARY RESULTS* Echocardiogram 2D Echocardiogram has been performed.  Cristela BlueHege, Coriana Angello 02/06/2018, 9:42 AM

## 2018-02-06 NOTE — Progress Notes (Signed)
ANTICOAGULATION CONSULT NOTE - Initial Consult  Pharmacy Consult for heparin Indication: chest pain/ACS  Allergies  Allergen Reactions  . Amoxicillin Rash and Other (See Comments)    Has patient had a PCN reaction causing immediate rash, facial/tongue/throat swelling, SOB or lightheadedness with hypotension: Unknown Has patient had a PCN reaction causing severe rash involving mucus membranes or skin necrosis: Unknown Has patient had a PCN reaction that required hospitalization: Unknown Has patient had a PCN reaction occurring within the last 10 years: No If all of the above answers are "NO", then may proceed with Cephalosporin use.     Patient Measurements: Height: 6\' 4"  (193 cm) Weight: 185 lb (83.9 kg) IBW/kg (Calculated) : 86.8 Heparin Dosing Weight: 84 kg  Vital Signs: Temp: 98.2 F (36.8 C) (09/12 0836) Temp Source: Oral (09/12 0836) BP: 128/93 (09/12 0836) Pulse Rate: 91 (09/12 0836)  Labs: Recent Labs    02/05/18 1944 02/05/18 2240 02/06/18 0841  HGB 12.7*  --  13.4  HCT 38.7*  --  39.7*  PLT 271  --  238  APTT  --   --  87*  LABPROT  --   --  13.8  INR  --   --  1.07  HEPARINUNFRC  --   --  0.37  CREATININE 0.97  --  0.85  CKTOTAL 492*  --   --   TROPONINI 0.07* 0.16*  --     Estimated Creatinine Clearance: 154.9 mL/min (by C-G formula based on SCr of 0.85 mg/dL).   Medical History: History reviewed. No pertinent past medical history.  Medications:  Scheduled:  . aspirin EC  81 mg Oral Daily  . docusate sodium  100 mg Oral BID  . flavoxATE  200 mg Oral Once  . naLOXone (NARCAN)  injection  0.2 mg Intravenous Once    Assessment: Patient admitted s/t drug overdose from heroin that was apparently cut w/ fentanyl. Patient has initial trops of 0.07 >> 0.16. Patient does not take any meds or anticoagulants. EKG shows borderline ST elevation Patient is being started on heparin drip for acute infarct   Goal of Therapy:  Heparin level 0.3-0.7  units/ml Monitor platelets by anticoagulation protocol: Yes   Plan:  9/12 AM: HL 0.37. Level is therapeutic x 1.  Will continue heparin infusion @ 1100 units/hr  Recheck confirmatory HL in 6 hours. Will monitor daily CBC's and adjust per HL's  Gardner CandleSheema M Orlen Leedy, PharmD, BCPS Clinical Pharmacist 02/06/2018 9:21 AM

## 2018-02-06 NOTE — ED Notes (Signed)
Report given to [employer] RN

## 2018-02-07 LAB — GLUCOSE, CAPILLARY: Glucose-Capillary: 96 mg/dL (ref 70–99)

## 2018-02-07 LAB — CBC
HCT: 35 % — ABNORMAL LOW (ref 40.0–52.0)
HEMOGLOBIN: 12.3 g/dL — AB (ref 13.0–18.0)
MCH: 30.4 pg (ref 26.0–34.0)
MCHC: 35 g/dL (ref 32.0–36.0)
MCV: 86.8 fL (ref 80.0–100.0)
Platelets: 179 10*3/uL (ref 150–440)
RBC: 4.04 MIL/uL — AB (ref 4.40–5.90)
RDW: 13.3 % (ref 11.5–14.5)
WBC: 8.8 10*3/uL (ref 3.8–10.6)

## 2018-02-07 LAB — HEPARIN LEVEL (UNFRACTIONATED): Heparin Unfractionated: 0.35 IU/mL (ref 0.30–0.70)

## 2018-02-07 LAB — HIV ANTIBODY (ROUTINE TESTING W REFLEX): HIV Screen 4th Generation wRfx: NONREACTIVE

## 2018-02-07 NOTE — Progress Notes (Signed)
Patients expresses wants to leave AMA, pulled IV out and removed tele box off, per patient "does not feel safe here" wants to leave now. Per mother will take him to [place]. Dr. Tobi BastosPyreddy notified. AMA form signed and placed in chart.

## 2018-02-07 NOTE — Clinical Social Work Note (Addendum)
CSW received referral for substance abuse consult.  CSW met with patient and his two parents, patient gave CSW permission to speak in front of his parents.  Patient and his family had requested information about trying to go to substance abuse program directly from the hospital.  CSW explained to patient and his family that unfortunately, the hospital does not do direct admits to substance abuse treatment centers anymore.  Patient and his family expressed understanding, and were disappointed that patient's are not able to direct admit to treatment programs.  CSW provided information about substance abuse programs locally along with Substance abuse and mental health services administration (SAMHSA) national hotline along with Narcotics and Alcoholics Anonymous schedule and information.  Patient's parents had asked about a program called [facility] in [place] and if they needed a referral, CSW, contacted [facility] at [phone] and they said patient does not need referral to participate in the program.  CSW was informed that patient and his family just need to show up and the facility will assess and see if they can accept patient, CSW passed this information to family.  CSW updated bedside nurse that CSW had spoken to patient and his family.  Formal assessment to follow at a later time.  Jones Broom. Christine, MSW, [place]  02/07/2018 5:19 PM

## 2018-02-07 NOTE — Discharge Summary (Signed)
[facility] - [place] at [facility]   PATIENT NAME: Christopher Stein    MR#:  [id number]  DATE OF BIRTH:  12/30/1990  DATE OF ADMISSION:  02/05/2018 ADMITTING PHYSICIAN: Cammy Copa, MD  DATE OF DISCHARGE: 02/07/2018  6:16 PM  PRIMARY CARE PHYSICIAN: Patient, No Pcp Per   ADMISSION DIAGNOSIS:  Elevated troponin [R74.8] SIRS (systemic inflammatory response syndrome) (HCC) [R65.10] Acute encephalopathy [G93.40] Accidental drug overdose, initial encounter [T50.901A]  DISCHARGE DIAGNOSIS:  Principal Problem:   Opiate overdose (HCC) Active Problems:   Drug overdose   Opiate dependence (HCC)   Substance induced mood disorder (HCC) Elevated troponins  SECONDARY DIAGNOSIS:  History reviewed. No pertinent past medical history.   ADMITTING HISTORY  Christopher Stein  is a 27 y.o. male with a known history of heroin abuse.Patient was brought to the emergency room for accidental heroin overdose.  He admits that he uses heroin weekly and he injected his normal amount yesterday, but became very lethargic.  Patient suspects that this time the heroin was mixed with fentanyl.  He was found unresponsive by the family and brought via EMS to the hospital.Patient is currently very drowsy, unable to provide further reliable history.  He denies however any chest pain.Labs done emergency room are notable for elevated WBC at 23.2.  Troponin level is elevated at 0.07.EKG shows sinus tachycardia with heart rate at 110.  There are delta waves in V3, V4 and V5 noted. Patient is admitted for further evaluation   HOSPITAL COURSE:  Patient admitted to telemetry. Serial troponins were done.Patient was anticoagulated with heparin drip.Cardiology and psychiatry consultations done. Cardiology did not recommend any intervention.Psychiatry recommended no inpatient psych admission and no one on one observation as patient did not have suicidal, homicidal thoughts.Patient not danger to himself or others according to  psychiatry attending. Family wanted to place patient in detox center. Patient signed AMA and left hospital.  CONSULTS OBTAINED:  Treatment Team:  Lamar Blinks, MD Clapacs, Jackquline Denmark, MD  DRUG ALLERGIES:   Allergies  Allergen Reactions  . Amoxicillin Rash and Other (See Comments)    Has patient had a PCN reaction causing immediate rash, facial/tongue/throat swelling, SOB or lightheadedness with hypotension: Unknown Has patient had a PCN reaction causing severe rash involving mucus membranes or skin necrosis: Unknown Has patient had a PCN reaction that required hospitalization: Unknown Has patient had a PCN reaction occurring within the last 10 years: No If all of the above answers are "NO", then may proceed with Cephalosporin use.     DISCHARGE MEDICATIONS:     Today  Patient signed AMA and left hospital  VITAL SIGNS:  Blood pressure (!) 154/105, pulse 72, temperature 98 F (36.7 C), temperature source Oral, resp. rate 18, height 6\' 4"  (1.93 m), weight 82 kg, SpO2 100 %.  I/O:    Intake/Output Summary (Last 24 hours) at 02/07/2018 1940 Last data filed at 02/07/2018 1030 Gross per 24 hour  Intake 1923.31 ml  Output -  Net 1923.31 ml    PHYSICAL EXAMINATION:  Physical Exam  GENERAL:  27 y.o.-year-old patient lying in the bed with no acute distress.  LUNGS: Normal breath sounds bilaterally, no wheezing, rales,rhonchi or crepitation. No use of accessory muscles of respiration.  CARDIOVASCULAR: S1, S2 normal. No murmurs, rubs, or gallops.  ABDOMEN: Soft, non-tender, non-distended. Bowel sounds present. No organomegaly or mass.  NEUROLOGIC: Moves all 4 extremities. PSYCHIATRIC: The patient is alert and oriented x 3.  SKIN: No obvious rash, lesion, or ulcer.  DATA REVIEW:   CBC Recent Labs  Lab 02/07/18 0153  WBC 8.8  HGB 12.3*  HCT 35.0*  PLT 179    Chemistries  Recent Labs  Lab 02/05/18 1944 02/06/18 0841  NA 141 139  K 4.7 4.3  CL 106 103  CO2 28  26  GLUCOSE 130* 94  BUN 20 16  CREATININE 0.97 0.85  CALCIUM 8.9 8.9  AST 147*  --   ALT 131*  --   ALKPHOS 65  --   BILITOT 0.3  --     Cardiac Enzymes Recent Labs  Lab 02/06/18 2323  TROPONINI 0.28*    Microbiology Results  Results for orders placed or performed during the hospital encounter of 02/05/18  Blood culture (routine x 2)     Status: None (Preliminary result)   Collection Time: 02/05/18  9:14 PM  Result Value Ref Range Status   Specimen Description BLOOD BLOOD RIGHT WRIST  Final   Special Requests   Final    BOTTLES DRAWN AEROBIC AND ANAEROBIC Blood Culture results may not be optimal due to an excessive volume of blood received in culture bottles   Culture   Final    NO GROWTH 2 DAYS Performed at [facility], [address] [place], [place] [address]    Report Status PENDING  Incomplete  Blood culture (routine x 2)     Status: None (Preliminary result)   Collection Time: 02/05/18  9:14 PM  Result Value Ref Range Status   Specimen Description BLOOD BLOOD RIGHT HAND  Final   Special Requests   Final    BOTTLES DRAWN AEROBIC AND ANAEROBIC Blood Culture adequate volume   Culture   Final    NO GROWTH 2 DAYS Performed at [facility], [address] [place], [place] [address]    Report Status PENDING  Incomplete    RADIOLOGY:  Dg Chest 2 View  Result Date: 02/05/2018 CLINICAL DATA:  Overdose EXAM: CHEST - 2 VIEW COMPARISON:  None. FINDINGS: The heart size and mediastinal contours are within normal limits. There is no focal infiltrate, pulmonary edema, or pleural effusion. The visualized skeletal structures are unremarkable. IMPRESSION: No active cardiopulmonary disease. Electronically Signed   By: Sherian ReinWei-Chen  Lin M.D.   On: 02/05/2018 21:07   Ct Head Wo Contrast  Result Date: 02/05/2018 CLINICAL DATA:  Heroin overdose question containing fentanyl, found on bathroom floor by family EXAM: CT HEAD WITHOUT CONTRAST TECHNIQUE: Contiguous axial  images were obtained from the base of the skull through the vertex without intravenous contrast. Sagittal and coronal MPR images reconstructed from axial data set. COMPARISON:  None FINDINGS: Brain: Normal ventricular morphology. No midline shift or mass effect. Normal appearance of brain parenchyma. No intracranial hemorrhage, mass lesion, evidence of acute infarction, or extra-axial fluid collection. Vascular: No hyperdense vessels Skull: Intact Sinuses/Orbits: Clear Other: N/A IMPRESSION: Normal exam. Electronically Signed   By: Ulyses SouthwardMark  Boles M.D.   On: 02/05/2018 23:27    Follow up with PCP in 1 week.  Management plans discussed with the patient, family and they are in agreement.  CODE STATUS: Full code    Code Status Orders  (From admission, onward)         Start     Ordered   02/06/18 0836  Full code  Continuous     02/06/18 0835        Code Status History    This patient has a current code status but no historical code status.  TOTAL TIME TAKING CARE OF THIS PATIENT ON DAY OF DISCHARGE: more than 30 minutes.   Ihor Austin M.D on 02/07/2018 at 7:40 PM  Between 7am to 6pm - Pager - [phone]  After 6pm go to [email or web address] - password [email or web address]  [facility] [place] [facility]  Office  [phone]  CC: Primary care physician; Patient, No Pcp Per  Note: This dictation was prepared with Dragon dictation along with smaller phrase technology. Any transcriptional errors that result from this process are unintentional.

## 2018-02-07 NOTE — Progress Notes (Signed)
Patient feeling very anxious, feeling jittery, pt requesting something else to help with withdrawals, scheduled ativan given. Parents at bedside.Dr. Toni Amendlapacs notified no new orders received.

## 2018-02-07 NOTE — Progress Notes (Addendum)
[facility] -  at [facility]   PATIENT NAME: Christopher Stein    MR#:  [id number]  DATE OF BIRTH:  1991-05-26  SUBJECTIVE:  CHIEF COMPLAINT:   Chief Complaint  Patient presents with  . Drug Overdose  Patient seen and evaluated today Patient's mother was at bedside Patient has long-term substance abuse Patient's family wants patient to be placed in [facility] center No complaints of any chest pain, shortness of breath  REVIEW OF SYSTEMS:    ROS  CONSTITUTIONAL: No documented fever. No fatigue, weakness. No weight gain, no weight loss.  EYES: No blurry or double vision.  ENT: No tinnitus. No postnasal drip. No redness of the oropharynx.  RESPIRATORY: No cough, no wheeze, no hemoptysis. No dyspnea.  CARDIOVASCULAR: No chest pain. No orthopnea. No palpitations. No syncope.  GASTROINTESTINAL: No nausea, no vomiting or diarrhea. No abdominal pain. No melena or hematochezia.  GENITOURINARY: No dysuria or hematuria.  ENDOCRINE: No polyuria or nocturia. No heat or cold intolerance.  HEMATOLOGY: No anemia. No bruising. No bleeding.  INTEGUMENTARY: No rashes. No lesions.  MUSCULOSKELETAL: No arthritis. No swelling. No gout.  NEUROLOGIC: No numbness, tingling, or ataxia. No seizure-type activity.  PSYCHIATRIC: No anxiety. No insomnia. No ADD.   DRUG ALLERGIES:   Allergies  Allergen Reactions  . Amoxicillin Rash and Other (See Comments)    Has patient had a PCN reaction causing immediate rash, facial/tongue/throat swelling, SOB or lightheadedness with hypotension: Unknown Has patient had a PCN reaction causing severe rash involving mucus membranes or skin necrosis: Unknown Has patient had a PCN reaction that required hospitalization: Unknown Has patient had a PCN reaction occurring within the last 10 years: No If all of the above answers are "NO", then may proceed with Cephalosporin use.     VITALS:  Blood pressure (!) 144/89, pulse 88, temperature 97.7  F (36.5 C), temperature source Oral, resp. rate 18, height 6\' 4"  (1.93 m), weight 82 kg, SpO2 100 %.  PHYSICAL EXAMINATION:   Physical Exam  GENERAL:  27 y.o.-year-old patient lying in the bed with no acute distress.  EYES: Pupils equal, round, reactive to light and accommodation. No scleral icterus. Extraocular muscles intact.  HEENT: Head atraumatic, normocephalic. Oropharynx and nasopharynx clear.  NECK:  Supple, no jugular venous distention. No thyroid enlargement, no tenderness.  LUNGS: Normal breath sounds bilaterally, no wheezing, rales, rhonchi. No use of accessory muscles of respiration.  CARDIOVASCULAR: S1, S2 normal. No murmurs, rubs, or gallops.  ABDOMEN: Soft, nontender, nondistended. Bowel sounds present. No organomegaly or mass.  EXTREMITIES: No cyanosis, clubbing or edema b/l.    NEUROLOGIC: Cranial nerves II through XII are intact. No focal Motor or sensory deficits b/l.   PSYCHIATRIC: The patient is alert and oriented x 3.  Mood pleasant. SKIN: No obvious rash, lesion, or ulcer.   LABORATORY PANEL:   CBC Recent Labs  Lab 02/07/18 0153  WBC 8.8  HGB 12.3*  HCT 35.0*  PLT 179   ------------------------------------------------------------------------------------------------------------------ Chemistries  Recent Labs  Lab 02/05/18 1944 02/06/18 0841  NA 141 139  K 4.7 4.3  CL 106 103  CO2 28 26  GLUCOSE 130* 94  BUN 20 16  CREATININE 0.97 0.85  CALCIUM 8.9 8.9  AST 147*  --   ALT 131*  --   ALKPHOS 65  --   BILITOT 0.3  --    ------------------------------------------------------------------------------------------------------------------  Cardiac Enzymes Recent Labs  Lab 02/06/18 2323  TROPONINI 0.28*   ------------------------------------------------------------------------------------------------------------------  RADIOLOGY:  Dg Chest 2 View  Result Date: 02/05/2018 CLINICAL DATA:  Overdose EXAM: CHEST - 2 VIEW COMPARISON:  None.  FINDINGS: The heart size and mediastinal contours are within normal limits. There is no focal infiltrate, pulmonary edema, or pleural effusion. The visualized skeletal structures are unremarkable. IMPRESSION: No active cardiopulmonary disease. Electronically Signed   By: Sherian Rein M.D.   On: 02/05/2018 21:07   Ct Head Wo Contrast  Result Date: 02/05/2018 CLINICAL DATA:  Heroin overdose question containing fentanyl, found on bathroom floor by family EXAM: CT HEAD WITHOUT CONTRAST TECHNIQUE: Contiguous axial images were obtained from the base of the skull through the vertex without intravenous contrast. Sagittal and coronal MPR images reconstructed from axial data set. COMPARISON:  None FINDINGS: Brain: Normal ventricular morphology. No midline shift or mass effect. Normal appearance of brain parenchyma. No intracranial hemorrhage, mass lesion, evidence of acute infarction, or extra-axial fluid collection. Vascular: No hyperdense vessels Skull: Intact Sinuses/Orbits: Clear Other: N/A IMPRESSION: Normal exam. Electronically Signed   By: Ulyses Southward M.D.   On: 02/05/2018 23:27   Echo Left ventricle: The cavity size was mildly dilated. Wall   thickness was normal. Systolic function was normal. The estimated   ejection fraction was in the range of 50% to 55%. Regional wall   motion abnormalities cannot be excluded. Left ventricular   diastolic function parameters were normal. - Aortic valve: Valve area (Vmax): 2.93 cm^2. - Left atrium: The atrium was mildly dilated. - Right ventricle: The cavity size was mildly dilated. Wall   thickness was normal. - Right atrium: The atrium was mildly dilated.   ASSESSMENT AND PLAN:  27 year old male patient with history of depression, substance abuse currently under hospitalist service for drug overdose with heroin mixed with fentanyl  -Drug overdose Has history of depression according to the family members Psychiatric consultation appreciated One-on-one  observation for safety discontinued as per psychiatry recommendations No in patient psychiatry admission recommended Follow-up with social worker for [facility] placement once medically stable  -Acute encephalopathy secondary to drug overdose improved Mental status back to baseline  -Elevated troponin S/p Cardiology evaluation  Cardiac ischemia unlikely Discontinue anticoagulation with heparin Continue aspirin  echocardiogram EF 50 to 55%  -DVT prophylaxis Ambulation recommended  -Substance abuse Social worker follow-up  Family wants patient to be sent to [facility] once medically stable   All the records are reviewed and case discussed with Care Management/Social Worker. Management plans discussed with the patient, family and they are in agreement.  CODE STATUS: Full code  DVT Prophylaxis: SCDs  TOTAL TIME TAKING CARE OF THIS PATIENT: 33 minutes.   POSSIBLE D/C IN 2 to 3 DAYS, DEPENDING ON CLINICAL CONDITION.  Ihor Austin M.D on 02/07/2018 at 1:59 PM  Between 7am to 6pm - Pager - [phone]  After 6pm go to [email or web address] - password [email or web address]  [facility] [place] [facility]  Office  [phone]  CC: Primary care physician; Patient, No Pcp Per  Note: This dictation was prepared with Dragon dictation along with smaller phrase technology. Any transcriptional errors that result from this process are unintentional.

## 2018-02-07 NOTE — Progress Notes (Signed)
ANTICOAGULATION CONSULT NOTE - Initial Consult  Pharmacy Consult for heparin Indication: chest pain/ACS  Allergies  Allergen Reactions  . Amoxicillin Rash and Other (See Comments)    Has patient had a PCN reaction causing immediate rash, facial/tongue/throat swelling, SOB or lightheadedness with hypotension: Unknown Has patient had a PCN reaction causing severe rash involving mucus membranes or skin necrosis: Unknown Has patient had a PCN reaction that required hospitalization: Unknown Has patient had a PCN reaction occurring within the last 10 years: No If all of the above answers are "NO", then may proceed with Cephalosporin use.     Patient Measurements: Height: 6\' 4"  (193 cm) Weight: 185 lb (83.9 kg) IBW/kg (Calculated) : 86.8 Heparin Dosing Weight: 84 kg  Vital Signs: Temp: 98.2 F (36.8 C) (09/12 2001) Temp Source: Oral (09/12 2001) BP: 139/96 (09/12 2001) Pulse Rate: 80 (09/12 2001)  Labs: Recent Labs    02/05/18 1944  02/06/18 0841 02/06/18 1156 02/06/18 1442 02/06/18 1752 02/06/18 1933 02/06/18 2323 02/07/18 0153  HGB 12.7*  --  13.4  --   --   --   --   --  12.3*  HCT 38.7*  --  39.7*  --   --   --   --   --  35.0*  PLT 271  --  238  --   --   --   --   --  179  APTT  --   --  87*  --   --   --   --   --   --   LABPROT  --   --  13.8  --   --   --   --   --   --   INR  --   --  1.07  --   --   --   --   --   --   HEPARINUNFRC  --    < > 0.37  --  0.27*  --  0.39  --  0.35  CREATININE 0.97  --  0.85  --   --   --   --   --   --   CKTOTAL 492*  --   --   --   --   --   --   --   --   TROPONINI 0.07*   < >  --  0.46*  --  0.36*  --  0.28*  --    < > = values in this interval not displayed.    Estimated Creatinine Clearance: 154.9 mL/min (by C-G formula based on SCr of 0.85 mg/dL).   Medical History: History reviewed. No pertinent past medical history.  Medications:  Scheduled:  . aspirin EC  81 mg Oral Daily  . buprenorphine-naloxone  2 tablet  Sublingual BID  . docusate sodium  100 mg Oral BID  . flavoxATE  200 mg Oral Once  . folic acid  1 mg Oral Daily  . LORazepam  0-4 mg Oral Q6H   Followed by  . [START ON 02/08/2018] LORazepam  0-4 mg Oral Q12H  . multivitamin with minerals  1 tablet Oral Daily  . naLOXone (NARCAN)  injection  0.2 mg Intravenous Once  . thiamine  100 mg Oral Daily   Or  . thiamine  100 mg Intravenous Daily    Assessment: Patient admitted s/t drug overdose from heroin that was apparently cut w/ fentanyl. Patient has initial trops of 0.07 >> 0.16. Patient does not take any meds  or anticoagulants. EKG shows borderline ST elevation Patient is being started on heparin drip for acute infarct  9/12 AM: HL 0.37. Level is therapeutic x 1. Heparin infusing @ 1000 units/hr.   Goal of Therapy:  Heparin level 0.3-0.7 units/ml Monitor platelets by anticoagulation protocol: Yes   Plan:  9/12 1400: HL 0.27. Level is subtherapeutic.  Will increase heparin infusion to 1250 units/hr  Recheck HL in 6 hours. Will monitor daily CBC's and adjust per HL's  9/12:  HL @ 19:33 = 0.39 Will continue pt on current rate and recheck HL in 6 hrs on 9/13 @ 0200.   09/13 @ 0200 HL 0.35 therapeutic. Will continue current rate and will recheck w/ am labs.  Thomasene Rippleavid  Knowledge [place]andon, PharmD Clinical Pharmacist 02/07/2018 3:40 AM

## 2018-02-07 NOTE — Clinical Social Work Note (Signed)
Clinical Social Work Assessment  Patient Details  Name: Christopher Stein MRN: [id number] Date of Birth: Apr 21, 1991  Date of referral:  02/07/18               Reason for consult:  Substance Use/ETOH Abuse, Family Concerns                Permission sought to share information with:  Family Supports Permission granted to share information::  Yes, Verbal Permission Granted  Name::     Surber,Glenn Father   [phone]   Agency::     Relationship::     Contact Information:     Housing/Transportation Living arrangements for the past 2 months:  Single Family Home Source of Information:  Patient, Parent Patient Interpreter Needed:  None Criminal Activity/Legal Involvement Pertinent to Current Situation/Hospitalization:  No - Comment as needed Significant Relationships:  Parents Lives with:  Parents Do you feel safe going back to the place where you live?  Yes Need for family participation in patient care:  No (Coment)  Care giving concerns:  Patient and his family expressed concerns about trying to get substance abuse treatment placement or services in place.   Social Worker assessment / plan:  Patient is a 27 year old male who lives with his parents.  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  Patient stated he is currently not working regularly, and it is frustrating for him because he wants to work but he is struggling with the substance abuse and finding a permanent job.  Patient was admitted due to overdose on Heroine.  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  Patient states he had been clean and dry for about 5 years until, his brother committed suicide last summer and his cousin who was like a brother died due to a drug overdose on heroine.  Patient then started crying CSW provided support to patient by listening to him express his  feelings.  CSW asked patient how he started using drugs and patient states when he was 27 years old he hurt his back and was put on pain medications and he became addicted to them.  Patient stated he started using marijuana and other drugs when he was younger.  Patient informed this CSW that he has not had to be hospitalized until this overdose.  Patient states he was not trying to commit suicide, he just thinks the heroine was laced with fentanyl and it caused him to become unresponsive.  CSW asked if patient had ever tried to go to counseling or to see a psychiatrist and he said no.  CSW asked patient how his home environment is, and he said he lives with both of his parents and they get along okay, however the family has had several tragedies within the last year which makes it hard for him to feel better about everything.  Patient expressed he wants to get some help now, because he said this hospitalization scared him and he feels like he really needs to change.  CSW encouraged patient and his family to follow up with some of the substance abuse treatment center programs in order to try to get the appropriate help the patient needs.  CSW provided substance abuse treatment program information, and also contacted [facility], which is facility family asked about.  [facility] informed this CSW that patient does not need  a referral to received treatment, and they just need to go to facility and see if they are able to accept him.  Employment status:  Temporary Insurance information:  Self Pay (Medicaid Pending) PT Recommendations:  Not assessed at this time Information / Referral to community resources:  Outpatient Substance Abuse Treatment Options, Residential Substance Abuse Treatment Options, Support Groups  Patient/Family's Response to care:  Patient and family were appreciative of information given regarding substance abuse programs.  Patient/Family's Understanding of and Emotional Response to  Diagnosis, Current Treatment, and Prognosis: Patient and family feel that he needs to receive substance abuse treatment, and they are going to try to find a place that will accept patient.  Emotional Assessment Appearance:  Appears stated age Attitude/Demeanor/Rapport:  Crying Affect (typically observed):  Appropriate, Calm, Hopeless, Stable, Sad, Tearful/Crying Orientation:  Oriented to Self, Oriented to Place, Oriented to  Time, Oriented to Situation Alcohol / Substance use:  Illicit Drugs, Alcohol Use Psych involvement (Current and /or in the community):  Yes (Comment)(Hospital psychiatrist seeing patient.)  Discharge Needs  Concerns to be addressed:  Grief and Loss Concerns, Coping/Stress Concerns, Substance Abuse Concerns Readmission within the last 30 days:  No Current discharge risk:  Substance Abuse Barriers to Discharge:  Active Substance Use, Continued Medical Work up   [employer] [employer], Damier Disano R, LCSWA 02/07/2018, 5:54 PM

## 2018-02-10 LAB — CULTURE, BLOOD (ROUTINE X 2)
Culture: NO GROWTH
Culture: NO GROWTH
SPECIAL REQUESTS: ADEQUATE

## 2018-03-05 NOTE — Consult Note (Signed)
Reason for Consult: Borderline troponins normal EKG Referring Physician: Dr. Juliene Pina hospitalist  Christopher Stein is an 27 y.o. male.  HPI: Patient is a 34 year old white male non-smoker substance abuser mostly heroin he was recently injected heroin that may have been mixed with fentanyl patient was found unresponsive by family members EMS was called the patient was resuscitated including Narcan in the emergency room he is found to have elevated white count borderline troponins EKG has sinus tachycardia rate of 110 with nonspecific changes patient was then admitted to telemetry for further evaluation and management and recovery from accidental overdose.  Patient denies any cardiac history denies any significant palpitations or tachycardia no syncope denies any chest pain  History reviewed. No pertinent past medical history.  Past Surgical History:  Procedure Laterality Date  . TONSILLECTOMY      History reviewed. No pertinent family history.  Social History:  reports that he has never smoked. He has never used smokeless tobacco. He reports that he drinks alcohol. He reports that he has current or past drug history. Drug: Marijuana.  Allergies:  Allergies  Allergen Reactions  . Amoxicillin Rash and Other (See Comments)    Has patient had a PCN reaction causing immediate rash, facial/tongue/throat swelling, SOB or lightheadedness with hypotension: Unknown Has patient had a PCN reaction causing severe rash involving mucus membranes or skin necrosis: Unknown Has patient had a PCN reaction that required hospitalization: Unknown Has patient had a PCN reaction occurring within the last 10 years: No If all of the above answers are "NO", then may proceed with Cephalosporin use.     Medications: I have reviewed the patient's current medications.  No results found for this or any previous visit (from the past 48 hour(s)).  No results found.  Review of Systems  Constitutional: Positive for  malaise/fatigue and weight loss.  HENT: Negative.   Eyes: Negative.   Respiratory: Positive for shortness of breath.   Cardiovascular: Negative.   Gastrointestinal: Negative.   Genitourinary: Negative.   Musculoskeletal: Positive for myalgias.  Skin: Negative.   Neurological: Positive for dizziness, loss of consciousness and weakness.  Endo/Heme/Allergies: Negative.   Psychiatric/Behavioral: Positive for depression and substance abuse.   Blood pressure (!) 154/105, pulse 72, temperature 98 F (36.7 C), temperature source Oral, resp. rate 18, height 6\' 4"  (1.93 m), weight 82 kg, SpO2 100 %. Physical Exam  Nursing note and vitals reviewed. Constitutional: He is oriented to person, place, and time. He appears well-developed and well-nourished.  HENT:  Head: Normocephalic and atraumatic.  Eyes: Pupils are equal, round, and reactive to light. Conjunctivae and EOM are normal.  Neck: Normal range of motion. Neck supple.  Cardiovascular: Normal rate, regular rhythm and normal heart sounds.  Respiratory: Effort normal and breath sounds normal.  GI: Soft. Bowel sounds are normal.  Musculoskeletal: Normal range of motion.  Neurological: He is alert and oriented to person, place, and time. He has normal reflexes.  Skin: Skin is warm and dry.  Psychiatric: He has a normal mood and affect.    Assessment/Plan: Borderline troponin Abnormal EKG Elevated white count Accidental heroin overdose Substance abuse Elevated LFTs Acute encephalopathy secondary to drug overdose . Plan Agree admit to telemetry Troponins were present demand ischemia from accidental overdose EKGs are normal for this young individual Do not recommend any further cardiac assessment evaluation Proceed with aggressive psychiatric counseling for substance abuse management No direct cardiac medications indicated  Jaymin Waln D Kevina Piloto 03/05/2018, 7:52 AM

## 2018-08-27 DEATH — deceased

## 2020-02-23 IMAGING — CT CT HEAD W/O CM
3 series · 16 of 47 positions shown, 19 images · non-contrast
Comparison: None

CLINICAL DATA: Heroin overdose question containing fentanyl, found
on bathroom floor by family

EXAM:
CT HEAD WITHOUT CONTRAST
TECHNIQUE: Contiguous axial images were obtained from the base of the skull
through the vertex without intravenous contrast. Sagittal and
coronal MPR images reconstructed from axial data set.

[Series 2: head wo · axial · 0.46mm/px · z∈[+343,+473]mm · 10 of 32 slices shown, 13 images]
[im 3/32  brain]
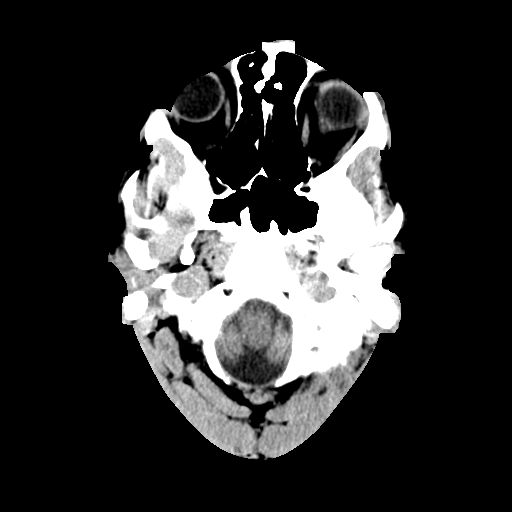
[im 3/32  bone]
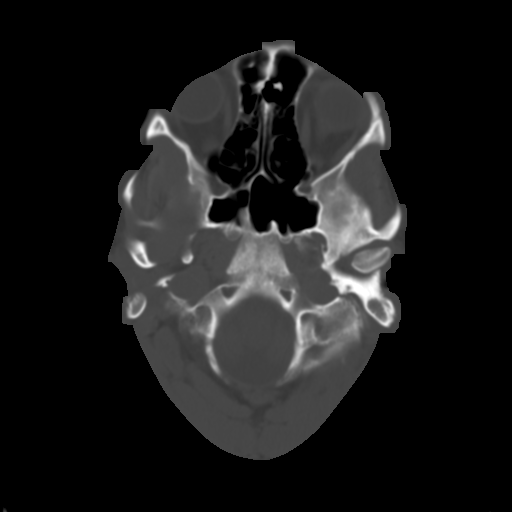
[im 6/32  brain]
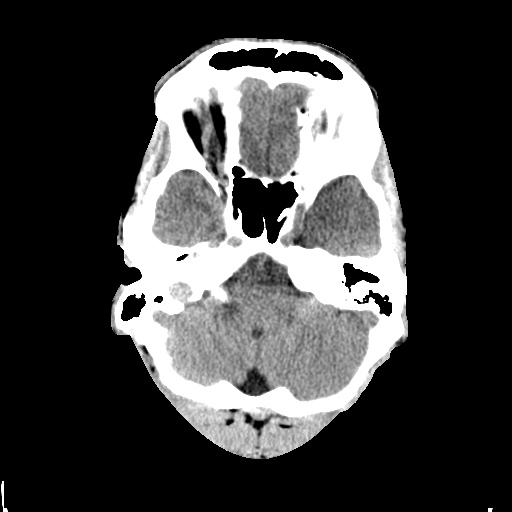
[im 9/32  brain]
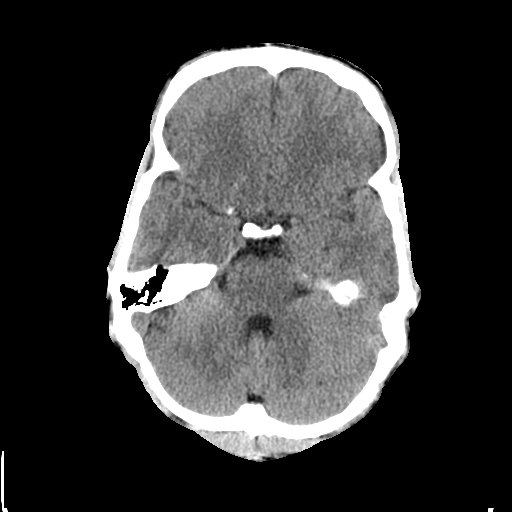
[im 11/32  brain]
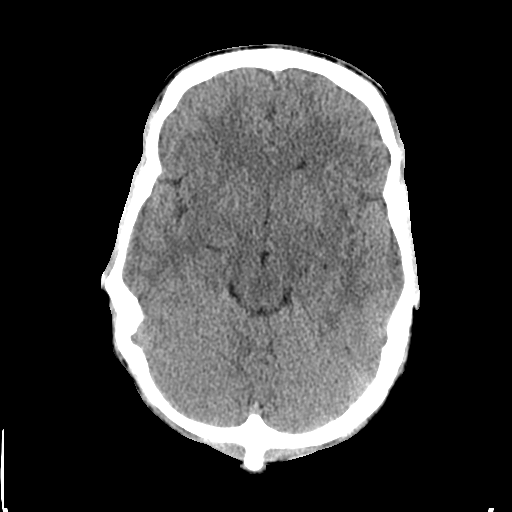
[im 14/32  brain]
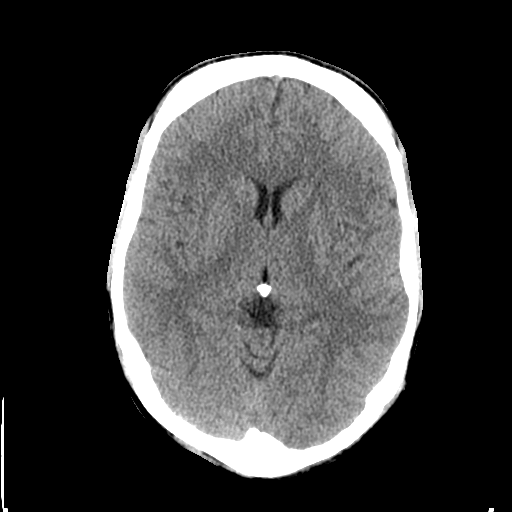
[im 14/32  bone]
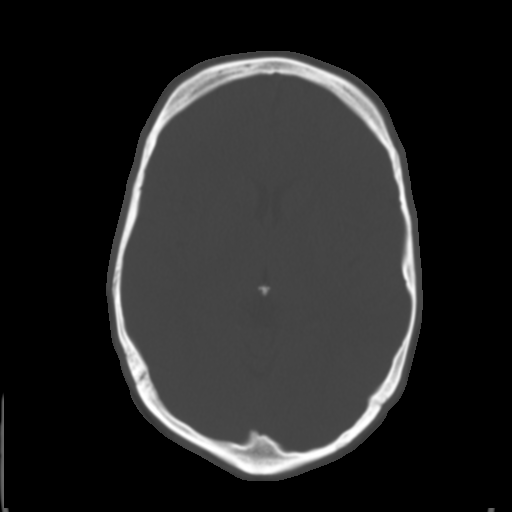
[im 18/32  brain]
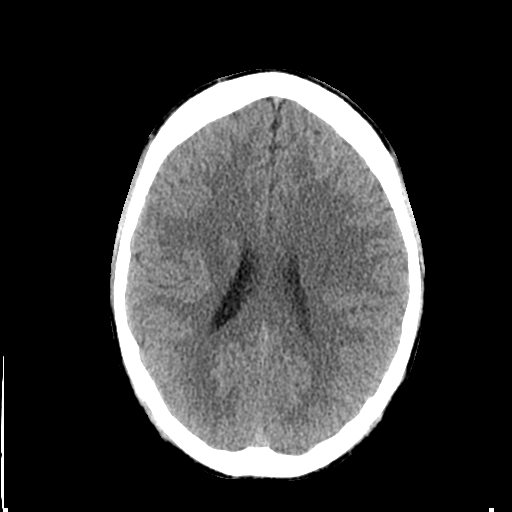
[im 21/32  brain]
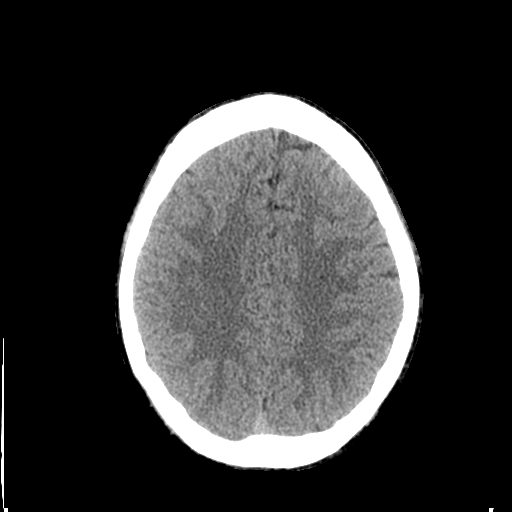
[im 24/32  brain]
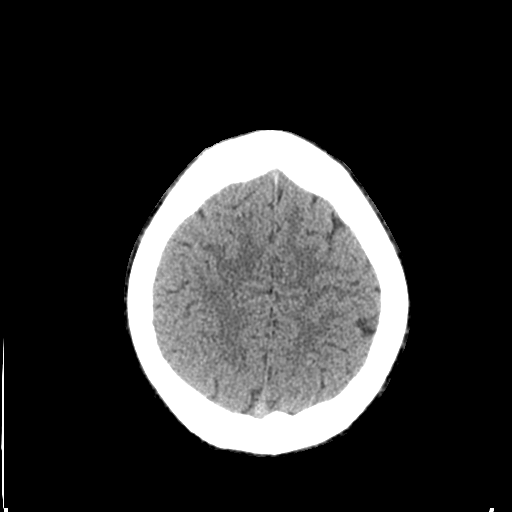
[im 26/32  brain]
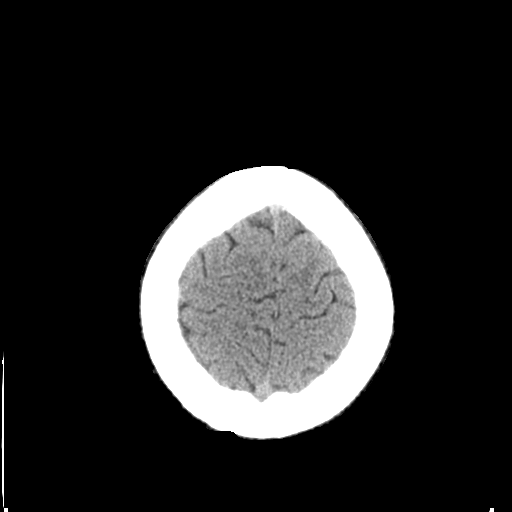
[im 26/32  bone]
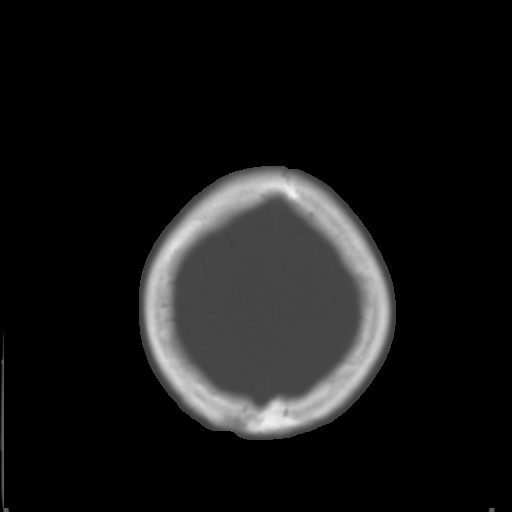
[im 29/32  brain]
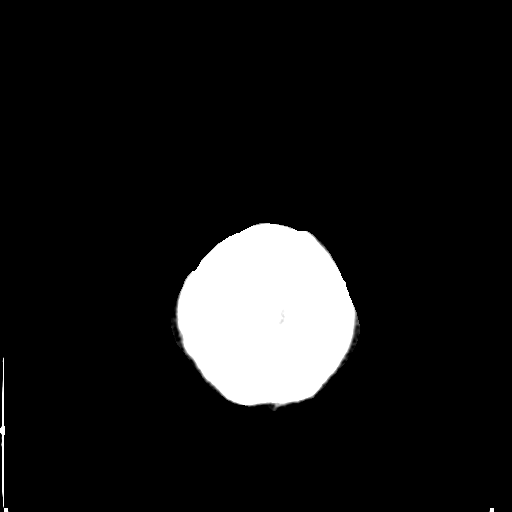

[Series 4: coronal soft tissue · coronal · 0.32mm/px · 3 of 67 slices shown]
[im 23/67  brain]
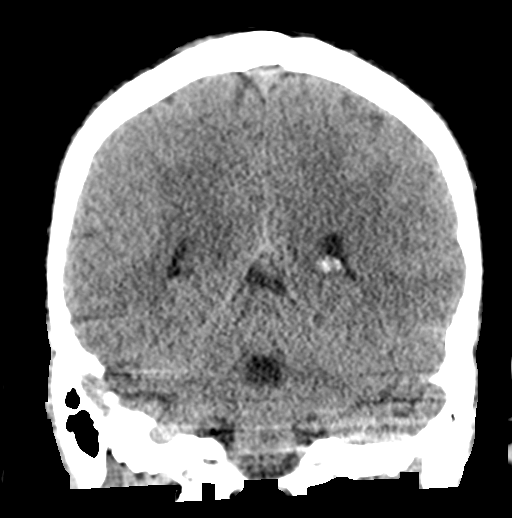
[im 30/67  brain]
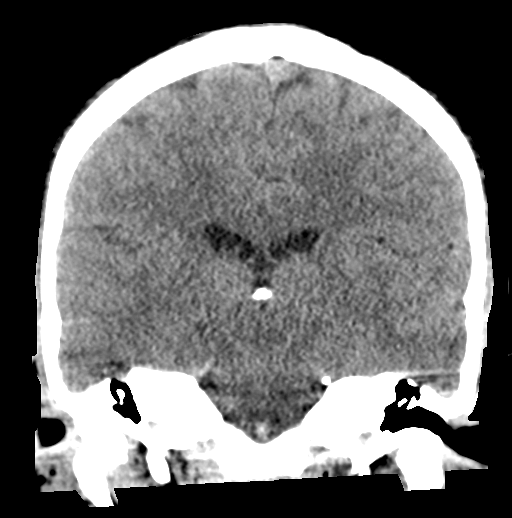
[im 37/67  brain]
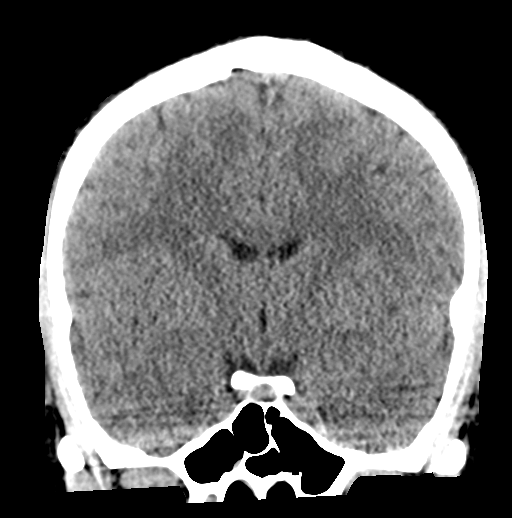

[Series 5: sagittal soft tissue · sagittal · 0.32mm/px · 3 of 51 slices shown]
[im 17/51  brain]
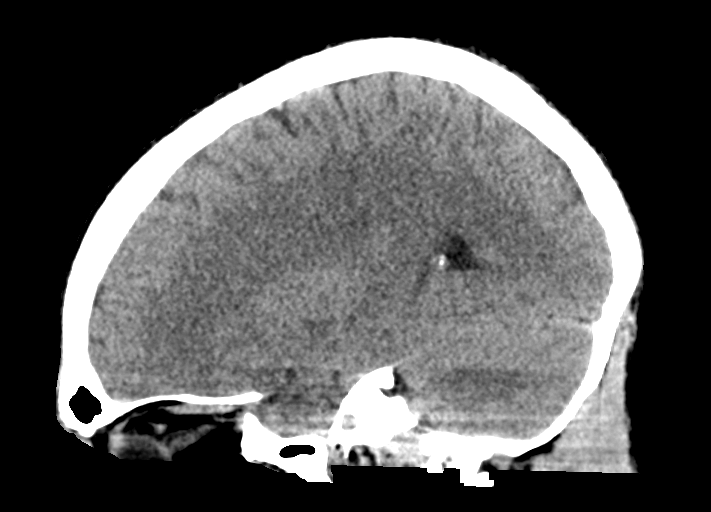
[im 26/51  brain]
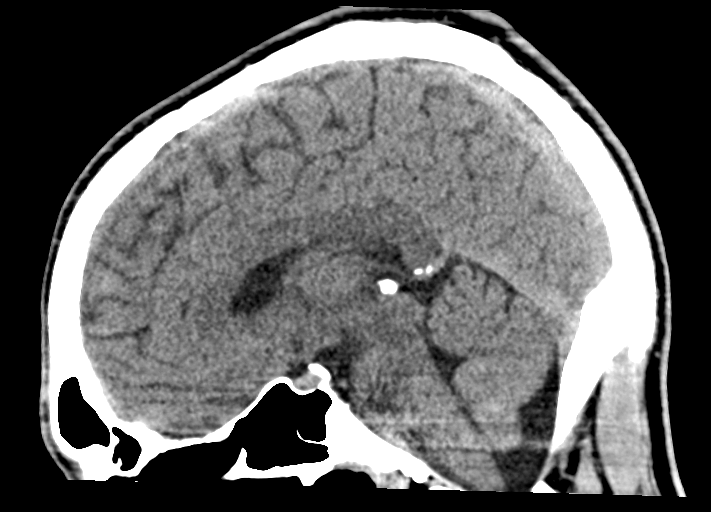
[im 34/51  brain]
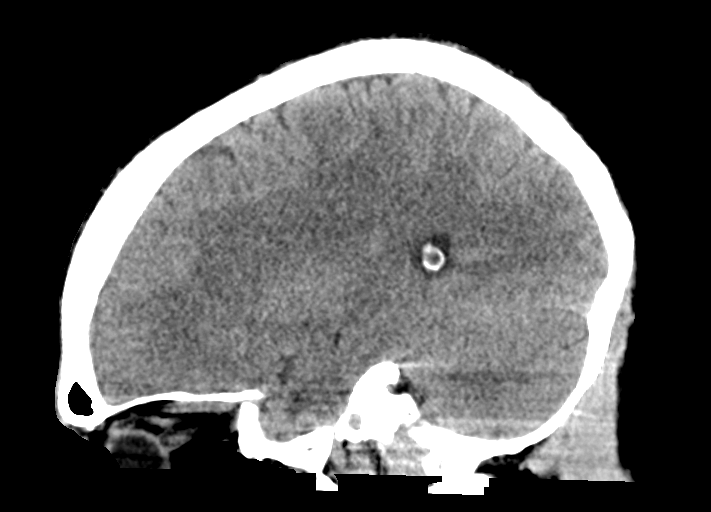

[16 of 47 positions shown; findings below may reference images not displayed]

FINDINGS: Brain: Normal ventricular morphology. No midline shift or mass
effect. Normal appearance of brain parenchyma. No intracranial
hemorrhage, mass lesion, evidence of acute infarction, or
extra-axial fluid collection.

Vascular: No hyperdense vessels

Skull: Intact

Sinuses/Orbits: Clear

Other: N/A
IMPRESSION: Normal exam.

## 2020-02-23 IMAGING — CR DG CHEST 2V
2 series · 2 of 2 positions shown · non-contrast
Comparison: None.

CLINICAL DATA: Overdose

EXAM:
CHEST - 2 VIEW

[chest lat]
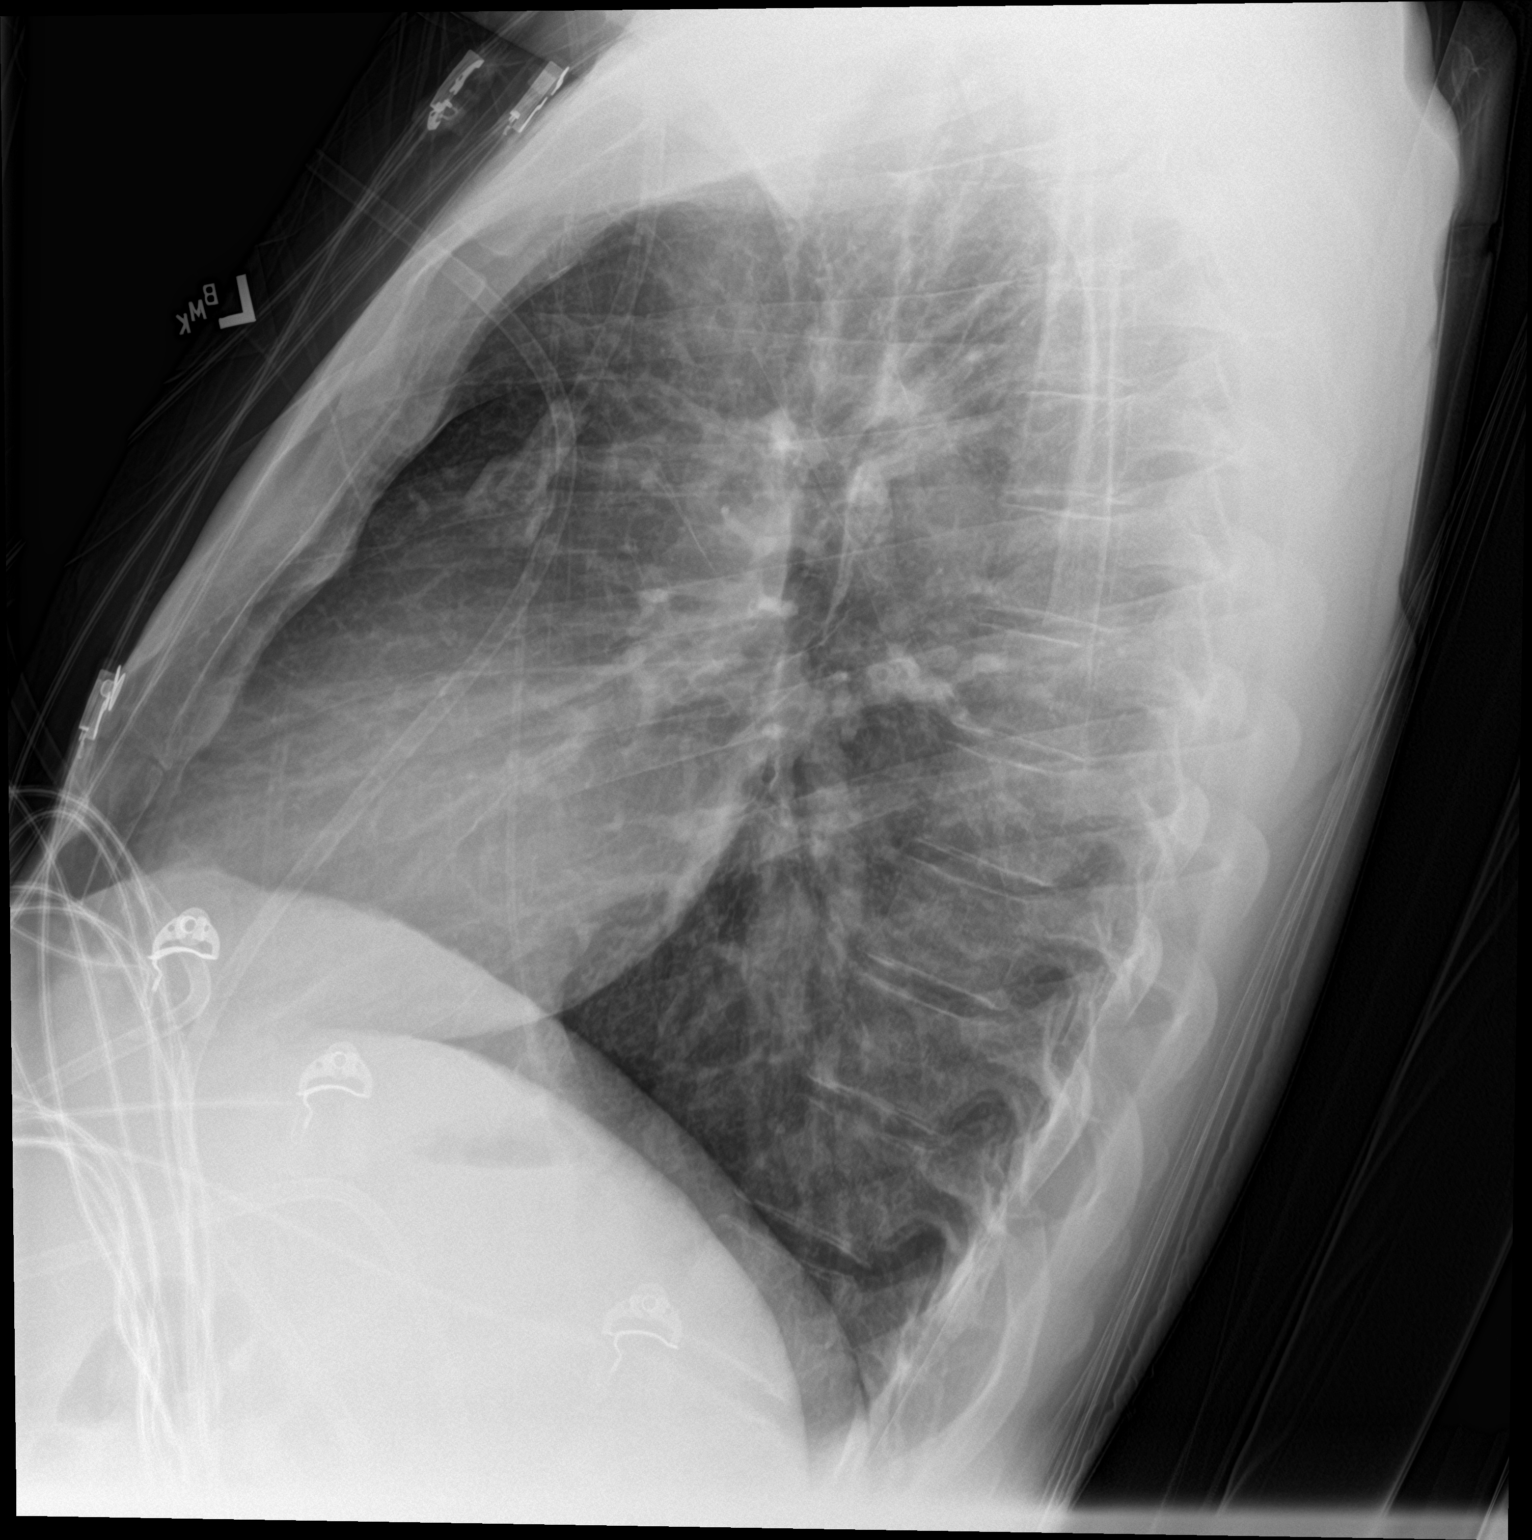

[chest ap]
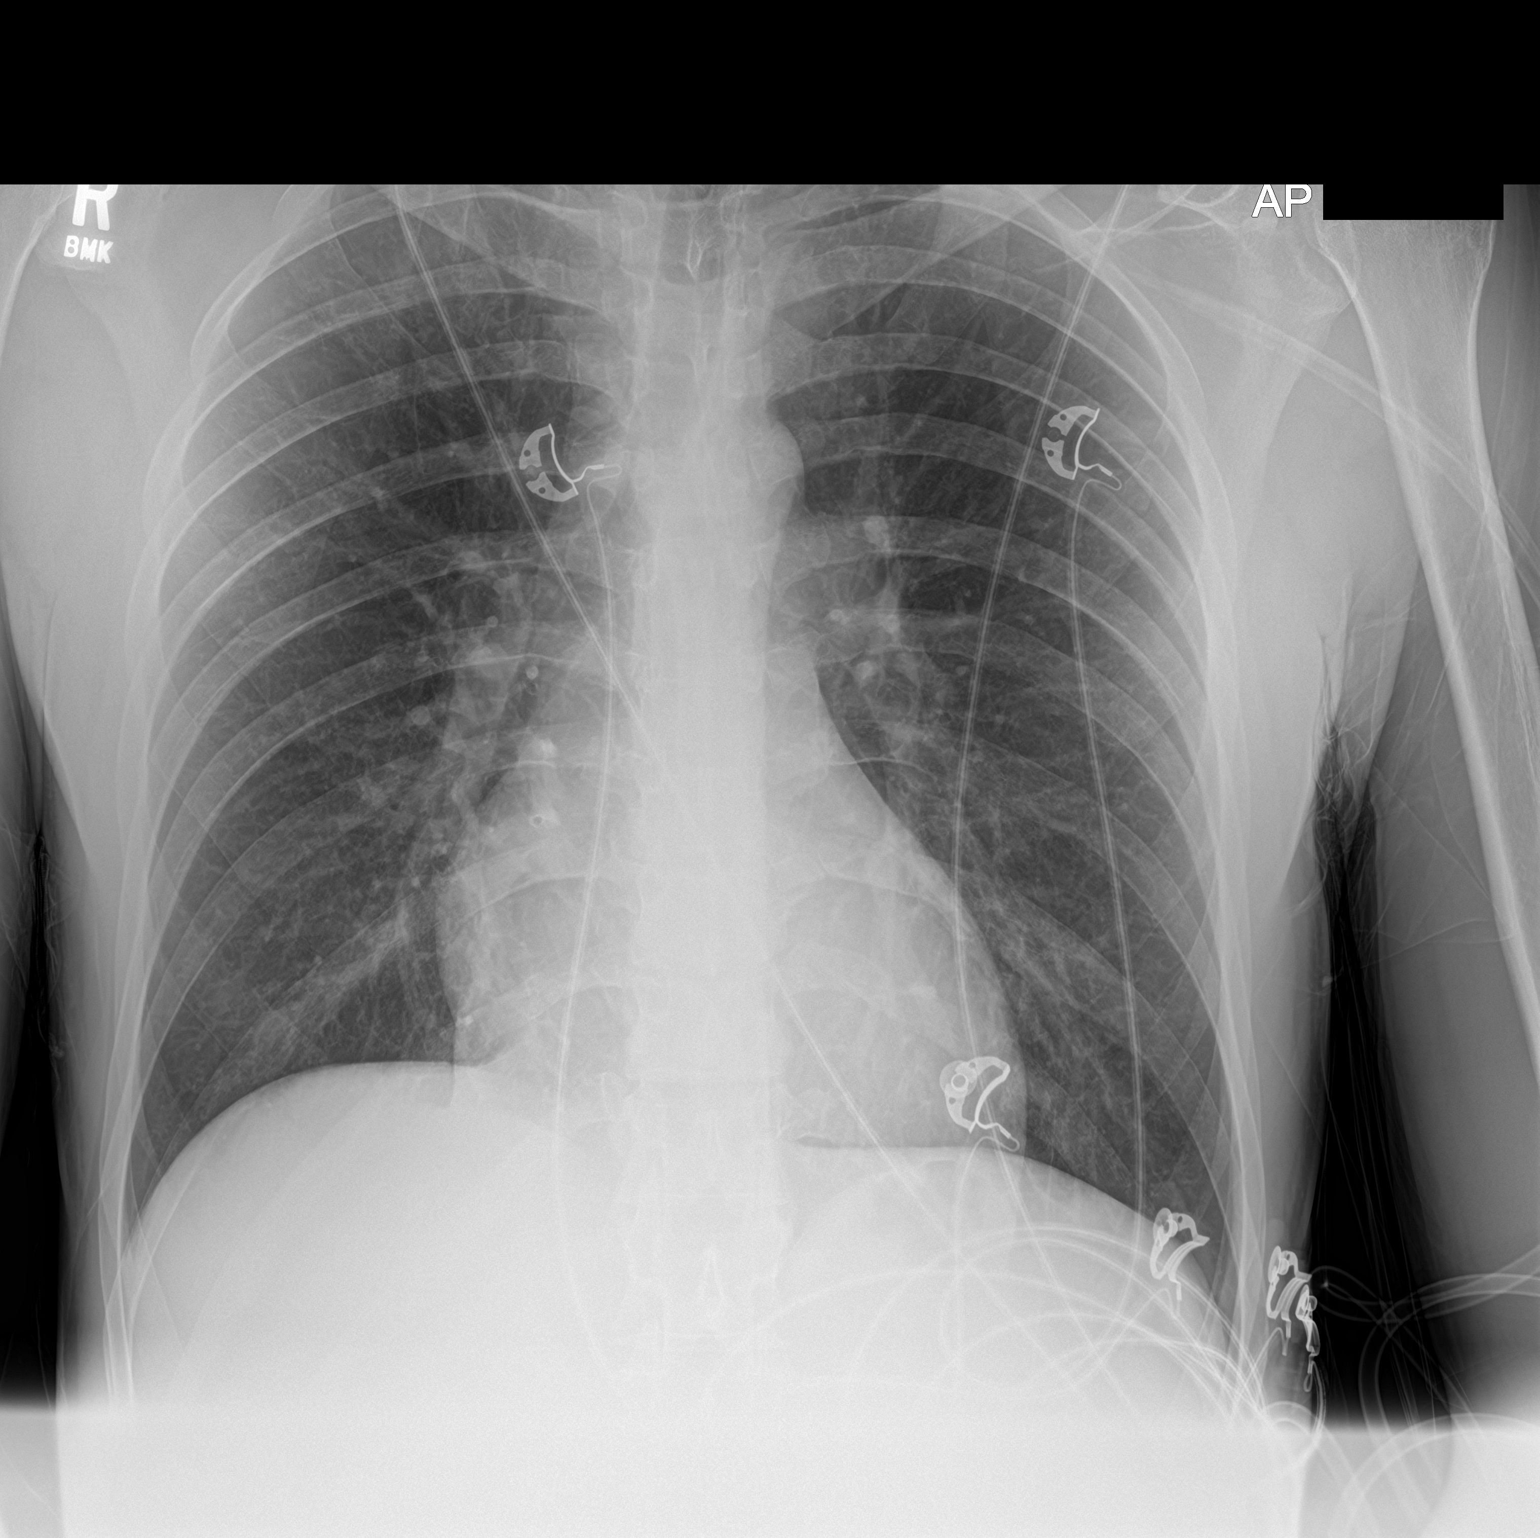

[2 of 2 positions shown; findings below may reference images not displayed]

FINDINGS: The heart size and mediastinal contours are within normal limits.
There is no focal infiltrate, pulmonary edema, or pleural effusion.
The visualized skeletal structures are unremarkable.
IMPRESSION: No active cardiopulmonary disease.
